# Patient Record
Sex: Male | Born: 1970
Health system: Southern US, Community
[De-identification: ages and names within clinical notes are randomized; demographics above are authoritative.]

## PROBLEM LIST (undated history)

## (undated) DIAGNOSIS — B019 Varicella without complication: Secondary | ICD-10-CM

## (undated) DIAGNOSIS — T7840XA Allergy, unspecified, initial encounter: Secondary | ICD-10-CM

## (undated) DIAGNOSIS — F319 Bipolar disorder, unspecified: Secondary | ICD-10-CM

## (undated) DIAGNOSIS — M1990 Unspecified osteoarthritis, unspecified site: Secondary | ICD-10-CM

## (undated) DIAGNOSIS — K219 Gastro-esophageal reflux disease without esophagitis: Secondary | ICD-10-CM

## (undated) DIAGNOSIS — F419 Anxiety disorder, unspecified: Secondary | ICD-10-CM

## (undated) HISTORY — PX: ANTERIOR CRUCIATE LIGAMENT REPAIR: SHX115

## (undated) HISTORY — PX: KNEE SURGERY: SHX244

## (undated) HISTORY — PX: ELBOW SURGERY: SHX618

## (undated) HISTORY — DX: Varicella without complication: B01.9

## (undated) HISTORY — DX: Bipolar disorder, unspecified: F31.9

## (undated) HISTORY — DX: Anxiety disorder, unspecified: F41.9

## (undated) HISTORY — DX: Gastro-esophageal reflux disease without esophagitis: K21.9

## (undated) HISTORY — DX: Unspecified osteoarthritis, unspecified site: M19.90

## (undated) HISTORY — DX: Allergy, unspecified, initial encounter: T78.40XA

---

## 2005-04-05 ENCOUNTER — Encounter (HOSPITAL_COMMUNITY): Admission: RE | Admit: 2005-04-05 | Discharge: 2005-04-14 | Payer: Self-pay | Admitting: Orthopedic Surgery

## 2005-04-05 ENCOUNTER — Emergency Department (HOSPITAL_COMMUNITY): Admission: EM | Admit: 2005-04-05 | Discharge: 2005-04-05 | Payer: Self-pay | Admitting: Emergency Medicine

## 2005-04-05 ENCOUNTER — Ambulatory Visit: Payer: Self-pay | Admitting: Orthopedic Surgery

## 2005-04-05 ENCOUNTER — Ambulatory Visit (HOSPITAL_COMMUNITY): Payer: Self-pay | Admitting: Orthopedic Surgery

## 2005-09-10 ENCOUNTER — Emergency Department (HOSPITAL_COMMUNITY): Admission: EM | Admit: 2005-09-10 | Discharge: 2005-09-10 | Payer: Self-pay | Admitting: Emergency Medicine

## 2008-02-17 ENCOUNTER — Emergency Department (HOSPITAL_COMMUNITY): Admission: EM | Admit: 2008-02-17 | Discharge: 2008-02-17 | Payer: Self-pay | Admitting: Family Medicine

## 2009-08-03 ENCOUNTER — Emergency Department (HOSPITAL_COMMUNITY): Admission: EM | Admit: 2009-08-03 | Discharge: 2009-08-04 | Payer: Self-pay | Admitting: Emergency Medicine

## 2009-08-04 ENCOUNTER — Ambulatory Visit: Payer: Self-pay | Admitting: Psychiatry

## 2009-08-04 ENCOUNTER — Inpatient Hospital Stay (HOSPITAL_COMMUNITY): Admission: AD | Admit: 2009-08-04 | Discharge: 2009-08-07 | Payer: Self-pay | Admitting: Psychiatry

## 2010-05-12 ENCOUNTER — Ambulatory Visit (HOSPITAL_COMMUNITY)
Admission: RE | Admit: 2010-05-12 | Discharge: 2010-05-12 | Payer: Self-pay | Source: Home / Self Care | Attending: Internal Medicine | Admitting: Internal Medicine

## 2010-06-17 ENCOUNTER — Encounter: Payer: Self-pay | Admitting: Orthopedic Surgery

## 2010-06-30 ENCOUNTER — Ambulatory Visit: Payer: Self-pay | Admitting: Orthopedic Surgery

## 2010-07-01 ENCOUNTER — Encounter: Payer: Self-pay | Admitting: Orthopedic Surgery

## 2010-07-07 LAB — HEPATIC FUNCTION PANEL
ALT: 40 U/L (ref 0–53)
AST: 22 U/L (ref 0–37)
Albumin: 4.1 g/dL (ref 3.5–5.2)
Total Bilirubin: 0.7 mg/dL (ref 0.3–1.2)

## 2010-07-07 LAB — DIFFERENTIAL
Basophils Absolute: 0 10*3/uL (ref 0.0–0.1)
Basophils Relative: 0 % (ref 0–1)
Lymphocytes Relative: 34 % (ref 12–46)
Monocytes Relative: 9 % (ref 3–12)
Neutro Abs: 4.4 10*3/uL (ref 1.7–7.7)
Neutrophils Relative %: 55 % (ref 43–77)

## 2010-07-07 LAB — URINALYSIS, ROUTINE W REFLEX MICROSCOPIC
Bilirubin Urine: NEGATIVE
Glucose, UA: NEGATIVE mg/dL
Hgb urine dipstick: NEGATIVE
Ketones, ur: NEGATIVE mg/dL
Protein, ur: NEGATIVE mg/dL
Urobilinogen, UA: 0.2 mg/dL (ref 0.0–1.0)

## 2010-07-07 LAB — ETHANOL: Alcohol, Ethyl (B): <5 mg/dL (ref 0–10)

## 2010-07-07 LAB — BASIC METABOLIC PANEL
CO2: 30 mEq/L (ref 19–32)
Calcium: 9.1 mg/dL (ref 8.4–10.5)
Creatinine, Ser: 1.1 mg/dL (ref 0.4–1.5)
GFR calc Af Amer: >60 mL/min (ref >60)
GFR calc non Af Amer: >60 mL/min (ref >60)
Sodium: 140 mEq/L (ref 135–145)

## 2010-07-07 LAB — RAPID URINE DRUG SCREEN, HOSP PERFORMED
Amphetamines: NOT DETECTED
Benzodiazepines: POSITIVE — AB
Tetrahydrocannabinol: POSITIVE — AB

## 2010-07-07 LAB — CBC
Hemoglobin: 15.1 g/dL (ref 13.0–17.0)
MCHC: 34.6 g/dL (ref 30.0–36.0)
RBC: 5.01 MIL/uL (ref 4.22–5.81)
WBC: 7.9 10*3/uL (ref 4.0–10.5)

## 2010-07-07 NOTE — Letter (Signed)
Summary: [facility] No Show  [facility]  [address]  [place], [place] [address]   Phone: [phone]  Fax: [phone]    07/01/10  [facility] Attention:  Referrals Fax:  [phone]   Re:    Ryan Hoffman DOB:    03/06/1971    The above named patient was a no show for the scheduled appointment:  Date:   06/30/10 Time:  10:00AM  Thank you for your kind referral.  Please feel free to contact our office if we can be of further service.  Sincerely,   [facility] Office of Dr. Terrance Mass, MD

## 2013-03-26 ENCOUNTER — Ambulatory Visit (HOSPITAL_COMMUNITY)
Admission: RE | Admit: 2013-03-26 | Discharge: 2013-03-26 | Disposition: A | Discharge status: Home / Self Care | Payer: 59 | Source: Ambulatory Visit | Attending: Sports Medicine | Admitting: Sports Medicine

## 2013-03-26 ENCOUNTER — Other Ambulatory Visit (HOSPITAL_COMMUNITY): Payer: Self-pay | Admitting: Sports Medicine

## 2013-03-26 DIAGNOSIS — IMO0002 Reserved for concepts with insufficient information to code with codable children: Secondary | ICD-10-CM

## 2013-03-26 DIAGNOSIS — T1590XA Foreign body on external eye, part unspecified, unspecified eye, initial encounter: Secondary | ICD-10-CM | POA: Insufficient documentation

## 2013-11-20 ENCOUNTER — Other Ambulatory Visit (HOSPITAL_COMMUNITY): Payer: Self-pay | Admitting: Physician Assistant

## 2013-11-20 DIAGNOSIS — R1031 Right lower quadrant pain: Secondary | ICD-10-CM

## 2013-11-20 DIAGNOSIS — R1032 Left lower quadrant pain: Secondary | ICD-10-CM

## 2013-11-23 ENCOUNTER — Ambulatory Visit (HOSPITAL_COMMUNITY)
Admission: RE | Admit: 2013-11-23 | Discharge: 2013-11-23 | Disposition: A | Discharge status: Home / Self Care | Payer: 59 | Source: Ambulatory Visit | Attending: Physician Assistant | Admitting: Physician Assistant

## 2013-11-23 DIAGNOSIS — R1031 Right lower quadrant pain: Secondary | ICD-10-CM

## 2013-11-23 DIAGNOSIS — R109 Unspecified abdominal pain: Secondary | ICD-10-CM | POA: Diagnosis present

## 2013-11-23 DIAGNOSIS — R1032 Left lower quadrant pain: Secondary | ICD-10-CM

## 2013-11-23 MED ORDER — IOHEXOL 300 MG/ML  SOLN
100.0000 mL | Freq: Once | INTRAMUSCULAR | Status: AC | PRN
  Administered 2013-11-23: 100 mL via INTRAVENOUS

## 2015-02-21 ENCOUNTER — Ambulatory Visit (INDEPENDENT_AMBULATORY_CARE_PROVIDER_SITE_OTHER): Payer: 59 | Admitting: Family

## 2015-02-21 ENCOUNTER — Other Ambulatory Visit (INDEPENDENT_AMBULATORY_CARE_PROVIDER_SITE_OTHER): Payer: 59

## 2015-02-21 ENCOUNTER — Encounter: Payer: Self-pay | Admitting: Family

## 2015-02-21 VITALS — BP 138/90 | HR 76 | Temp 98.3°F | Resp 18 | Ht 66.0 in | Wt 193.4 lb

## 2015-02-21 DIAGNOSIS — M25561 Pain in right knee: Secondary | ICD-10-CM

## 2015-02-21 DIAGNOSIS — F3162 Bipolar disorder, current episode mixed, moderate: Secondary | ICD-10-CM | POA: Insufficient documentation

## 2015-02-21 LAB — COMPREHENSIVE METABOLIC PANEL
ALBUMIN: 4.3 g/dL (ref 3.5–5.2)
ALK PHOS: 63 U/L (ref 39–117)
ALT: 34 U/L (ref 0–53)
AST: 19 U/L (ref 0–37)
BILIRUBIN TOTAL: 0.5 mg/dL (ref 0.2–1.2)
BUN: 15 mg/dL (ref 6–23)
CALCIUM: 9.5 mg/dL (ref 8.4–10.5)
CO2: 31 mEq/L (ref 19–32)
CREATININE: 1.02 mg/dL (ref 0.40–1.50)
Chloride: 104 mEq/L (ref 96–112)
GFR: 84.76 mL/min (ref >60.00)
Glucose, Bld: 87 mg/dL (ref 70–99)
Potassium: 4.3 mEq/L (ref 3.5–5.1)
Sodium: 139 mEq/L (ref 135–145)
TOTAL PROTEIN: 7 g/dL (ref 6.0–8.3)

## 2015-02-21 MED ORDER — DIVALPROEX SODIUM ER 500 MG PO TB24: 500.0000 mg | ORAL_TABLET | Freq: Every day | ORAL | Status: DC

## 2015-02-21 MED ORDER — LAMOTRIGINE 25 MG PO TABS: ORAL_TABLET | ORAL | Status: DC

## 2015-02-21 NOTE — Patient Instructions (Signed)
Thank you for choosing [facility].  Summary/Instructions:  If your symptoms worsen or fail to improve, please contact our office for further instruction, or in case of emergency go directly to the emergency room at the closest medical facility.   Please take your medication as prescribed   Bipolar Disorder Bipolar disorder is a mental illness. The term bipolar disorder actually is used to describe a group of disorders that all share varying degrees of emotional highs and lows that can interfere with daily functioning, such as work, school, or relationships. Bipolar disorder also can lead to drug abuse, hospitalization, and suicide. The emotional highs of bipolar disorder are periods of elation or irritability and high energy. These highs can range from a mild form (hypomania) to a severe form (mania). People experiencing episodes of hypomania may appear energetic, excitable, and highly productive. People experiencing mania may behave impulsively or erratically. They often make poor decisions. They may have difficulty sleeping. The most severe episodes of mania can involve having very distorted beliefs or perceptions about the world and seeing or hearing things that are not real (psychotic delusions and hallucinations).  The emotional lows of bipolar disorder (depression) also can range from mild to severe. Severe episodes of bipolar depression can involve psychotic delusions and hallucinations. Sometimes people with bipolar disorder experience a state of mixed mood. Symptoms of hypomania or mania and depression are both present during this mixed-mood episode. SIGNS AND SYMPTOMS There are signs and symptoms of the episodes of hypomania and mania as well as the episodes of depression. The signs and symptoms of hypomania and mania are similar but vary in severity. They include:  Inflated self-esteem or feeling of increased self-confidence.  Decreased need for sleep.  Unusual talkativeness  (rapid or pressured speech) or the feeling of a need to keep talking.  Sensation of racing thoughts or constant talking, with quick shifts between topics that may or may not be related (flight of ideas).  Decreased ability to focus or concentrate.  Increased purposeful activity, such as work, studies, or social activity, or nonproductive activity, such as pacing, squirming and fidgeting, or finger and toe tapping.  Impulsive behavior and use of poor judgment, resulting in high-risk activities, such as having unprotected sex or spending excessive amounts of money. Signs and symptoms of depression include the following:   Feelings of sadness, hopelessness, or helplessness.  Frequent or uncontrollable episodes of crying.  Lack of feeling anything or caring about anything.  Difficulty sleeping or sleeping too much.  Inability to enjoy the things you used to enjoy.   Desire to be alone all the time.   Feelings of guilt or worthlessness.  Lack of energy or motivation.   Difficulty concentrating, remembering, or making decisions.  Change in appetite or weight beyond normal fluctuations.  Thoughts of death or the desire to harm yourself. DIAGNOSIS  Bipolar disorder is diagnosed through an assessment by your caregiver. Your caregiver will ask questions about your emotional episodes. There are two main types of bipolar disorder. People with type I bipolar disorder have manic episodes with or without depressive episodes. People with type II bipolar disorder have hypomanic episodes and major depressive episodes, which are more serious than mild depression. The type of bipolar disorder you have can make an important difference in how your illness is monitored and treated. Your caregiver may ask questions about your medical history and use of alcohol or drugs, including prescription medication. Certain medical conditions and substances also can cause emotional highs and  lows that resemble  bipolar disorder (secondary bipolar disorder).  TREATMENT  Bipolar disorder is a long-term illness. It is best controlled with continuous treatment rather than treatment only when symptoms occur. The following treatments can be prescribed for bipolar disorders:  Medication--Medication can be prescribed by a doctor that is an expert in treating mental disorders (psychiatrists). Medications called mood stabilizers are usually prescribed to help control the illness. Other medications are sometimes added if symptoms of mania, depression, or psychotic delusions and hallucinations occur despite the use of a mood stabilizer.  Talk therapy--Some forms of talk therapy are helpful in providing support, education, and guidance. A combination of medication and talk therapy is best for managing the disorder over time. A procedure in which electricity is applied to your brain through your scalp (electroconvulsive therapy) is used in cases of severe mania when medication and talk therapy do not work or work too slowly.   This information is not intended to replace advice given to you by your health care provider. Make sure you discuss any questions you have with your health care provider.   Document Released: 07/12/2000 Document Revised: 04/26/2014 Document Reviewed: 05/01/2012 Elsevier Interactive Patient Education [employer]2016 [employer] Inc.

## 2015-02-21 NOTE — Assessment & Plan Note (Signed)
Bipolar appears uncontrolled with no medication and being easily agitated. Noted increased stability when on the medication. Start Lamictal and Depakote. Obtain BMET for baseline metabolic function prior to medication. Follow up in 1 month. Encouraged follow up with psychiatry for long term medication management.

## 2015-02-21 NOTE — Progress Notes (Signed)
Subjective:    Patient ID: Ryan LangeSteven C Scarlata, male    DOB: 1970/08/25, 44 y.o.   MRN: [id number]  Chief Complaint  Patient presents with  . Establish Care    was diagnosed bipolar years ago and said he did not like the way the medication made him feel, wife states he needs medication    HPI:  Ryan Hoffman is a 44 y.o. male who  has a past medical history of Arthritis; GERD (gastroesophageal reflux disease); Allergy; Bipolar 1 disorder (HCC); and Chicken pox. and presents today for an office visit to establish care. His wife is present for today's visit and provides some of the history  1.) Bipolar - Previously diagnosed with Bipolar and not currently maintained on medication. His wife describes that he become very agitated and has a very short fuse. He was previously on medications including Lamictal, Depakote and Xanax. He discontinued taking the medicationt 3 years ago. Reports sleepy feelings with the Xanax. He has been noted to have fatigue and depression-like symptoms for the past couple of years since being off the medication.   2.) Chronic knee pain - Associated symptom of pain located in his right knee has been going on for several years. Describes history of multiple surgeries. Pain is located throughout the entire knee. There are no modifying factors that make it better or worse.    No Known Allergies   No outpatient prescriptions prior to visit.   No facility-administered medications prior to visit.     Past Medical History  Diagnosis Date  . Arthritis   . GERD (gastroesophageal reflux disease)   . Allergy   . Bipolar 1 disorder (HCC)   . Chicken pox      Past Surgical History  Procedure Laterality Date  . Anterior cruciate ligament repair    . Knee surgery    . Elbow surgery       Family History  Problem Relation Age of Onset  . Hyperlipidemia Mother   . Hyperlipidemia Father   . Hypertension Father   . Arthritis Maternal Grandmother   . Heart  disease Maternal Grandmother   . Arthritis Maternal Grandfather   . Heart disease Maternal Grandfather   . Arthritis Paternal Grandmother   . Heart disease Paternal Grandmother   . Arthritis Paternal Grandfather   . Heart disease Paternal Grandfather     Social History   Social History  . Marital Status: Married    Spouse Name: [name]  . Number of Children: 4  . Years of Education: 12   Occupational History  . Electrician    Social History Main Topics  . Smoking status: Current Some Day Smoker    Types: Cigarettes  . Smokeless tobacco: Current User    Types: Snuff  . Alcohol Use: 3.6 - 7.2 oz/week    0 Standard drinks or equivalent, 6-12 Cans of beer per week  . Drug Use: No  . Sexual Activity: Not on file   Other Topics Concern  . Not on file   Social History Narrative   Denies religious beliefs effecting health care.     Review of Systems  Constitutional: Negative for fever and chills.  Respiratory: Negative for chest tightness.   Psychiatric/Behavioral: Positive for agitation.      Objective:    BP 138/90 mmHg  Pulse 76  Temp(Src) 98.3 F (36.8 C) (Oral)  Resp 18  Ht 5\' 6"  (1.676 m)  Wt 193 lb 6.4 oz (87.726 kg)  BMI 31.23  kg/m2  SpO2 98% Nursing note and vital signs reviewed.  Physical Exam  Constitutional: He is oriented to person, place, and time. He appears well-developed and well-nourished. No distress.  Cardiovascular: Normal rate, regular rhythm, normal heart sounds and intact distal pulses.   Pulmonary/Chest: Effort normal and breath sounds normal.  Musculoskeletal:  Right knee - No obvious deformity or discoloration noted. There is mild amount of edema. There is diffuse tenderness throughout the anterior and posterior aspects. Ligamentous and meniscal testing are negative. ROM is normal. Distal pulses and sensation are intact and appropriate.   Neurological: He is alert and oriented to person, place, and time.  Skin: Skin is warm and dry.    Psychiatric: He has a normal mood and affect. His behavior is normal. Judgment and thought content normal.       Assessment & Plan:   Problem List Items Addressed This Visit      Other   Bipolar 1 disorder, mixed, moderate (HCC) - Primary    Bipolar appears uncontrolled with no medication and being easily agitated. Noted increased stability when on the medication. Start Lamictal and Depakote. Obtain BMET for baseline metabolic function prior to medication. Follow up in 1 month. Encouraged follow up with psychiatry for long term medication management.       Relevant Orders   Comprehensive metabolic panel (Completed)   Right knee pain    Symptoms and exam consistent with arthritis secondary to multiple knee surgeries. Start Pennsaid sample. Follow up if symptoms improve with medication for refill.

## 2015-02-21 NOTE — Progress Notes (Signed)
Pre visit review using our clinic review tool, if applicable. No additional management support is needed unless otherwise documented below in the visit note.  Refused flu shot 

## 2015-02-21 NOTE — Assessment & Plan Note (Signed)
Symptoms and exam consistent with arthritis secondary to multiple knee surgeries. Start Pennsaid sample. Follow up if symptoms improve with medication for refill.

## 2015-02-24 ENCOUNTER — Telehealth: Payer: Self-pay | Admitting: Family

## 2015-02-24 NOTE — Telephone Encounter (Signed)
Please inform patient that his kidney function, liver function and electrolytes are all within the normal limits. This will serve as a baseline for further drug monitoring.

## 2015-02-26 NOTE — Telephone Encounter (Signed)
Pt aware of lab results 

## 2015-03-21 ENCOUNTER — Ambulatory Visit (INDEPENDENT_AMBULATORY_CARE_PROVIDER_SITE_OTHER): Payer: 59 | Admitting: Family

## 2015-03-21 ENCOUNTER — Encounter: Payer: Self-pay | Admitting: Family

## 2015-03-21 VITALS — BP 134/88 | HR 75 | Temp 97.6°F | Resp 18 | Ht 66.0 in | Wt 199.0 lb

## 2015-03-21 DIAGNOSIS — E349 Endocrine disorder, unspecified: Secondary | ICD-10-CM

## 2015-03-21 DIAGNOSIS — Z0000 Encounter for general adult medical examination without abnormal findings: Secondary | ICD-10-CM

## 2015-03-21 DIAGNOSIS — Z23 Encounter for immunization: Secondary | ICD-10-CM | POA: Diagnosis not present

## 2015-03-21 DIAGNOSIS — Z0001 Encounter for general adult medical examination with abnormal findings: Secondary | ICD-10-CM | POA: Insufficient documentation

## 2015-03-21 DIAGNOSIS — F3162 Bipolar disorder, current episode mixed, moderate: Secondary | ICD-10-CM

## 2015-03-21 MED ORDER — LAMOTRIGINE 100 MG PO TABS: 100.0000 mg | ORAL_TABLET | Freq: Every day | ORAL | Status: DC

## 2015-03-21 MED ORDER — DIVALPROEX SODIUM ER 500 MG PO TB24: 500.0000 mg | ORAL_TABLET | Freq: Every day | ORAL | Status: DC

## 2015-03-21 NOTE — Progress Notes (Signed)
Pre visit review using our clinic review tool, if applicable. No additional management support is needed unless otherwise documented below in the visit note. 

## 2015-03-21 NOTE — Assessment & Plan Note (Addendum)
1) Anticipatory Guidance: Discussed importance of wearing a seatbelt while driving and not texting while driving; changing batteries in smoke detector at least once annually; wearing suntan lotion when outside; eating a balanced and moderate diet; getting physical activity at least 30 minutes per day.  2) Immunizations / Screenings / Labs:  Tetanus updated today. Declines flu shot. All other immunizations are up to date per recommendations. Due for a vision screen which will scheduled at his convenience. All other screenings are up to date per recommendations. Obtain CBC, Testosterone, Lipid profile and TSH.   Overall well exam with risk factors for cardiovascular disease including nutrition, obesity, and tobacco use. Discussed importance of eating a balanced, varied and moderate diet that is focused on nutrient dense foods and low in saturated and transfer fats. Continue physical activity. Recommend weight loss of 5-10% of current body weight. Discussed tobacco cessation and information provided on steps to quit. Patient is not ready to quit at present time. We'll continue to monitor. Previous concern for low testosterone. Will check testosterone. Continue other healthy lifestyle behaviors and choices. Follow-up prevention exam in 1 year and follow-up office visit pending blood work results when completed.

## 2015-03-21 NOTE — Patient Instructions (Addendum)
Thank you for choosing [facility].  Summary/Instructions:  Please stop by the lab on the basement level of the building for your blood work. Your results will be released to MyChart (or called to you) after review, usually within 72 hours after test completion. If any changes need to be made, you will be notified at that same time.   Health Maintenance, Male A healthy lifestyle and preventative care can promote health and wellness.  Maintain regular health, dental, and eye exams.  Eat a healthy diet. Foods like vegetables, fruits, whole grains, low-fat dairy products, and lean protein foods contain the nutrients you need and are low in calories. Decrease your intake of foods high in solid fats, added sugars, and salt. Get information about a proper diet from your health care provider, if necessary.  Regular physical exercise is one of the most important things you can do for your health. Most adults should get at least 150 minutes of moderate-intensity exercise (any activity that increases your heart rate and causes you to sweat) each week. In addition, most adults need muscle-strengthening exercises on 2 or more days a week.   Maintain a healthy weight. The body mass index (BMI) is a screening tool to identify possible weight problems. It provides an estimate of body fat based on height and weight. Your health care provider can find your BMI and can help you achieve or maintain a healthy weight. For males 20 years and older:  A BMI below 18.5 is considered underweight.  A BMI of 18.5 to 24.9 is normal.  A BMI of 25 to 29.9 is considered overweight.  A BMI of 30 and above is considered obese.  Maintain normal blood lipids and cholesterol by exercising and minimizing your intake of saturated fat. Eat a balanced diet with plenty of fruits and vegetables. Blood tests for lipids and cholesterol should begin at age 43 and be repeated every 5 years. If your lipid or cholesterol levels  are high, you are over age 43, or you are at high risk for heart disease, you may need your cholesterol levels checked more frequently.Ongoing high lipid and cholesterol levels should be treated with medicines if diet and exercise are not working.  If you smoke, find out from your health care provider how to quit. If you do not use tobacco, do not start.  Lung cancer screening is recommended for adults aged 55-80 years who are at high risk for developing lung cancer because of a history of smoking. A yearly low-dose CT scan of the lungs is recommended for people who have at least a 30-pack-year history of smoking and are current smokers or have quit within the past 15 years. A pack year of smoking is smoking an average of 1 pack of cigarettes a day for 1 year (for example, a 30-pack-year history of smoking could mean smoking 1 pack a day for 30 years or 2 packs a day for 15 years). Yearly screening should continue until the smoker has stopped smoking for at least 15 years. Yearly screening should be stopped for people who develop a health problem that would prevent them from having lung cancer treatment.  If you choose to drink alcohol, do not have more than 2 drinks per day. One drink is considered to be 12 oz (360 mL) of beer, 5 oz (150 mL) of wine, or 1.5 oz (45 mL) of liquor.  Avoid the use of street drugs. Do not share needles with anyone. Ask for help if you  need support or instructions about stopping the use of drugs.  High blood pressure causes heart disease and increases the risk of stroke. High blood pressure is more likely to develop in:  People who have blood pressure in the end of the normal range (100-139/85-89 mm Hg).  People who are overweight or obese.  People who are African American.  If you are 16-2 years of age, have your blood pressure checked every 3-5 years. If you are 32 years of age or older, have your blood pressure checked every year. You should have your blood  pressure measured twice--once when you are at a hospital or clinic, and once when you are not at a hospital or clinic. Record the average of the two measurements. To check your blood pressure when you are not at a hospital or clinic, you can use:  An automated blood pressure machine at a pharmacy.  A home blood pressure monitor.  If you are 21-17 years old, ask your health care provider if you should take aspirin to prevent heart disease.  Diabetes screening involves taking a blood sample to check your fasting blood sugar level. This should be done once every 3 years after age 67 if you are at a normal weight and without risk factors for diabetes. Testing should be considered at a younger age or be carried out more frequently if you are overweight and have at least 1 risk factor for diabetes.  Colorectal cancer can be detected and often prevented. Most routine colorectal cancer screening begins at the age of 65 and continues through age 22. However, your health care provider may recommend screening at an earlier age if you have risk factors for colon cancer. On a yearly basis, your health care provider may provide home test kits to check for hidden blood in the stool. A small camera at the end of a tube may be used to directly examine the colon (sigmoidoscopy or colonoscopy) to detect the earliest forms of colorectal cancer. Talk to your health care provider about this at age 90 when routine screening begins. A direct exam of the colon should be repeated every 5-10 years through age 54, unless early forms of precancerous polyps or small growths are found.  People who are at an increased risk for hepatitis B should be screened for this virus. You are considered at high risk for hepatitis B if:  You were born in a country where hepatitis B occurs often. Talk with your health care provider about which countries are considered high risk.  Your parents were born in a high-risk country and you have not  received a shot to protect against hepatitis B (hepatitis B vaccine).  You have HIV or AIDS.  You use needles to inject street drugs.  You live with, or have sex with, someone who has hepatitis B.  You are a man who has sex with other men (MSM).  You get hemodialysis treatment.  You take certain medicines for conditions like cancer, organ transplantation, and autoimmune conditions.  Hepatitis C blood testing is recommended for all people born from [birth year] through 1964 and any individual with known risk factors for hepatitis C.  Healthy men should no longer receive prostate-specific antigen (PSA) blood tests as part of routine cancer screening. Talk to your health care provider about prostate cancer screening.  Testicular cancer screening is not recommended for adolescents or adult males who have no symptoms. Screening includes self-exam, a health care provider exam, and other screening tests. Consult with  your health care provider about any symptoms you have or any concerns you have about testicular cancer.  Practice safe sex. Use condoms and avoid high-risk sexual practices to reduce the spread of sexually transmitted infections (STIs).  You should be screened for STIs, including gonorrhea and chlamydia if:  You are sexually active and are younger than 24 years.  You are older than 24 years, and your health care provider tells you that you are at risk for this type of infection.  Your sexual activity has changed since you were last screened, and you are at an increased risk for chlamydia or gonorrhea. Ask your health care provider if you are at risk.  If you are at risk of being infected with HIV, it is recommended that you take a prescription medicine daily to prevent HIV infection. This is called pre-exposure prophylaxis (PrEP). You are considered at risk if:  You are a man who has sex with other men (MSM).  You are a heterosexual man who is sexually active with multiple  partners.  You take drugs by injection.  You are sexually active with a partner who has HIV.  Talk with your health care provider about whether you are at high risk of being infected with HIV. If you choose to begin PrEP, you should first be tested for HIV. You should then be tested every 3 months for as long as you are taking PrEP.  Use sunscreen. Apply sunscreen liberally and repeatedly throughout the day. You should seek shade when your shadow is shorter than you. Protect yourself by wearing long sleeves, pants, a wide-brimmed hat, and sunglasses year round whenever you are outdoors.  Tell your health care provider of new moles or changes in moles, especially if there is a change in shape or color. Also, tell your health care provider if a mole is larger than the size of a pencil eraser.  A one-time screening for abdominal aortic aneurysm (AAA) and surgical repair of large AAAs by ultrasound is recommended for men aged 65-75 years who are current or former smokers.  Stay current with your vaccines (immunizations).   This information is not intended to replace advice given to you by your health care provider. Make sure you discuss any questions you have with your health care provider.   Document Released: 10/02/2007 Document Revised: 04/26/2014 Document Reviewed: 08/31/2010 Elsevier Interactive Patient Education 2016 [employer].  Steps to Quit Smoking  Smoking tobacco can be harmful to your health and can affect almost every organ in your body. Smoking puts you, and those around you, at risk for developing many serious chronic diseases. Quitting smoking is difficult, but it is one of the best things that you can do for your health. It is never too late to quit. WHAT ARE THE BENEFITS OF QUITTING SMOKING? When you quit smoking, you lower your risk of developing serious diseases and conditions, such as:  Lung cancer or lung disease, such as COPD.  Heart disease.  Stroke.  Heart  attack.  Infertility.  Osteoporosis and bone fractures. Additionally, symptoms such as coughing, wheezing, and shortness of breath may get better when you quit. You may also find that you get sick less often because your body is stronger at fighting off colds and infections. If you are pregnant, quitting smoking can help to reduce your chances of having a baby of low birth weight. HOW DO I GET READY TO QUIT? When you decide to quit smoking, create a plan to make sure that  you are successful. Before you quit:  Pick a date to quit. Set a date within the next two weeks to give you time to prepare.  Write down the reasons why you are quitting. Keep this list in places where you will see it often, such as on your bathroom mirror or in your car or wallet.  Identify the people, places, things, and activities that make you want to smoke (triggers) and avoid them. Make sure to take these actions:  Throw away all cigarettes at home, at work, and in your car.  Throw away smoking accessories, such as Set designerashtrays and lighters.  Clean your car and make sure to empty the ashtray.  Clean your home, including curtains and carpets.  Tell your family, friends, and coworkers that you are quitting. Support from your loved ones can make quitting easier.  Talk with your health care provider about your options for quitting smoking.  Find out what treatment options are covered by your health insurance. WHAT STRATEGIES CAN I USE TO QUIT SMOKING?  Talk with your healthcare provider about different strategies to quit smoking. Some strategies include:  Quitting smoking altogether instead of gradually lessening how much you smoke over a period of time. Research shows that quitting "cold [place]key" is more successful than gradually quitting.  Attending in-person counseling to help you build problem-solving skills. You are more likely to have success in quitting if you attend several counseling sessions. Even short  sessions of 10 minutes can be effective.  Finding resources and support systems that can help you to quit smoking and remain smoke-free after you quit. These resources are most helpful when you use them often. They can include:  Online chats with a Veterinary surgeoncounselor.  Telephone quitlines.  Printed Materials engineerself-help materials.  Support groups or group counseling.  Text messaging programs.  Mobile phone applications.  Taking medicines to help you quit smoking. (If you are pregnant or breastfeeding, talk with your health care provider first.) Some medicines contain nicotine and some do not. Both types of medicines help with cravings, but the medicines that include nicotine help to relieve withdrawal symptoms. Your health care provider may recommend:  Nicotine patches, gum, or lozenges.  Nicotine inhalers or sprays.  Non-nicotine medicine that is taken by mouth. Talk with your health care provider about combining strategies, such as taking medicines while you are also receiving in-person counseling. Using these two strategies together makes you more likely to succeed in quitting than if you used either strategy on its own. If you are pregnant or breastfeeding, talk with your health care provider about finding counseling or other support strategies to quit smoking. Do not take medicine to help you quit smoking unless told to do so by your health care provider. WHAT THINGS CAN I DO TO MAKE IT EASIER TO QUIT? Quitting smoking might feel overwhelming at first, but there is a lot that you can do to make it easier. Take these important actions:  Reach out to your family and friends and ask that they support and encourage you during this time. Call telephone quitlines, reach out to support groups, or work with a counselor for support.  Ask people who smoke to avoid smoking around you.  Avoid places that trigger you to smoke, such as bars, parties, or smoke-break areas at work.  Spend time around people who do  not smoke.  Lessen stress in your life, because stress can be a smoking trigger for some people. To lessen stress, try:  Exercising regularly.  Deep-breathing exercises.  Yoga.  Meditating.  Performing a body scan. This involves closing your eyes, scanning your body from head to toe, and noticing which parts of your body are particularly tense. Purposefully relax the muscles in those areas.  Download or purchase mobile phone or tablet apps (applications) that can help you stick to your quit plan by providing reminders, tips, and encouragement. There are many free apps, such as QuitGuide from the [employer] developer for Disease Control and Prevention). You can find other support for quitting smoking (smoking cessation) through smokefree.gov and other websites. HOW WILL I FEEL WHEN I QUIT SMOKING? Within the first 24 hours of quitting smoking, you may start to feel some withdrawal symptoms. These symptoms are usually most noticeable 2-3 days after quitting, but they usually do not last beyond 2-3 weeks. Changes or symptoms that you might experience include:  Mood swings.  Restlessness, anxiety, or irritation.  Difficulty concentrating.  Dizziness.  Strong cravings for sugary foods in addition to nicotine.  Mild weight gain.  Constipation.  Nausea.  Coughing or a sore throat.  Changes in how your medicines work in your body.  A depressed mood.  Difficulty sleeping (insomnia). After the first 2-3 weeks of quitting, you may start to notice more positive results, such as:  Improved sense of smell and taste.  Decreased coughing and sore throat.  Slower heart rate.  Lower blood pressure.  Clearer skin.  The ability to breathe more easily.  Fewer sick days. Quitting smoking is very challenging for most people. Do not get discouraged if you are not successful the first time. Some people need to make many attempts to quit before they achieve long-term success. Do your best to  stick to your quit plan, and talk with your health care provider if you have any questions or concerns.   This information is not intended to replace advice given to you by your health care provider. Make sure you discuss any questions you have with your health care provider.   Document Released: 03/30/2001 Document Revised: 08/20/2014 Document Reviewed: 08/20/2014 Elsevier Interactive Patient Education [employer]

## 2015-03-21 NOTE — Progress Notes (Signed)
Subjective:    Patient ID: Ryan Hoffman, male    DOB: November 11, 1970, 44 y.o.   MRN: [id number]  Chief Complaint  Patient presents with  . CPE    Not fasting    HPI:  Ryan Hoffman is a 44 y.o. male who presents today for an annual wellness visit.   1) Health Maintenance -   Diet - Averages 2-3 meals per day consisting of breakfast sandwich, pork, beef, occasional fruits and vegetables; Caffeine intake 1-2 cups per day.   Exercise - Resistance training 3-4x per week.    2) Preventative Exams / Immunizations:  Dental -- Up to date  Vision -- Due for exam    Health Maintenance  Topic Date Due  . HIV Screening  03/31/1987  . TETANUS/TDAP  03/31/1991  . INFLUENZA VACCINE  01/02/2016 (Originally 11/18/2014)   Immunization History  Administered Date(s) Administered  . Tdap 03/21/2015    No Known Allergies   Outpatient Prescriptions Prior to Visit  Medication Sig Dispense Refill  . divalproex (DEPAKOTE ER) 500 MG 24 hr tablet Take 1 tablet (500 mg total) by mouth daily. 30 tablet 1  . lamoTRIgine (LAMICTAL) 25 MG tablet Week 1 and 2: Take 1 tablet by mouth daily. Week 3 and 4: Take 2 tablets by mouth daily 42 tablet 0   No facility-administered medications prior to visit.     Past Medical History  Diagnosis Date  . Arthritis   . GERD (gastroesophageal reflux disease)   . Allergy   . Bipolar 1 disorder (HCC)   . Chicken pox      Past Surgical History  Procedure Laterality Date  . Anterior cruciate ligament repair    . Knee surgery    . Elbow surgery       Family History  Problem Relation Age of Onset  . Hyperlipidemia Mother   . Hyperlipidemia Father   . Hypertension Father   . Arthritis Maternal Grandmother   . Heart disease Maternal Grandmother   . Arthritis Maternal Grandfather   . Heart disease Maternal Grandfather   . Arthritis Paternal Grandmother   . Heart disease Paternal Grandmother   . Arthritis Paternal Grandfather   . Heart  disease Paternal Grandfather      Social History   Social History  . Marital Status: Married    Spouse Name: [name]  . Number of Children: 4  . Years of Education: 12   Occupational History  . Electrician    Social History Main Topics  . Smoking status: Current Some Day Smoker    Types: Cigarettes  . Smokeless tobacco: Current User    Types: Snuff  . Alcohol Use: 3.6 - 7.2 oz/week    0 Standard drinks or equivalent, 6-12 Cans of beer per week  . Drug Use: No  . Sexual Activity: Not on file   Other Topics Concern  . Not on file   Social History Narrative   Denies religious beliefs effecting health care.       Review of Systems  Constitutional: Denies fever, chills, fatigue, or significant weight gain/loss. HENT: Head: Denies headache or neck pain Ears: Denies changes in hearing, ringing in ears, earache, drainage Nose: Denies discharge, stuffiness, itching, nosebleed, sinus pain Throat: Denies sore throat, hoarseness, dry mouth, sores, thrush Eyes: Denies loss/changes in vision, pain, redness, blurry/double vision, flashing lights Cardiovascular: Denies chest pain/discomfort, tightness, palpitations, shortness of breath with activity, difficulty lying down, swelling, sudden awakening with shortness of breath Respiratory: Denies shortness  of breath, cough, sputum production, wheezing Gastrointestinal: Denies dysphasia, heartburn, change in appetite, nausea, change in bowel habits, rectal bleeding, constipation, diarrhea, yellow skin or eyes Genitourinary: Denies frequency, urgency, burning/pain, blood in urine, incontinence, change in urinary strength. Musculoskeletal: Denies muscle/joint pain, stiffness, back pain, redness or swelling of joints, trauma Skin: Denies rashes, lumps, itching, dryness, color changes, or hair/nail changes Neurological: Denies dizziness, fainting, seizures, weakness, numbness, tingling, tremor Psychiatric - Denies nervousness, stress, depression  or memory loss Endocrine: Denies heat or cold intolerance, sweating, frequent urination, excessive thirst, changes in appetite Hematologic: Denies ease of bruising or bleeding     Objective:     BP 134/88 mmHg  Pulse 75  Temp(Src) 97.6 F (36.4 C) (Oral)  Resp 18  Ht  (1.676 m)  Wt 199 lb (90.266 kg)  BMI 32.13 kg/m2  SpO2 98% Nursing note and vital signs reviewed.  Physical Exam  Constitutional: He is oriented to person, place, and time. He appears well-developed and well-nourished.  HENT:  Head: Normocephalic.  Right Ear: Hearing, tympanic membrane, external ear and ear canal normal.  Left Ear: Hearing, tympanic membrane, external ear and ear canal normal.  Nose: Nose normal.  Mouth/Throat: Uvula is midline, oropharynx is clear and moist and mucous membranes are normal.  Eyes: Conjunctivae and EOM are normal. Pupils are equal, round, and reactive to light.  Neck: Neck supple. No JVD present. No tracheal deviation present. No thyromegaly present.  Cardiovascular: Normal rate, regular rhythm, normal heart sounds and intact distal pulses.   Pulmonary/Chest: Effort normal and breath sounds normal.  Abdominal: Soft. Bowel sounds are normal. He exhibits no distension and no mass. There is no tenderness. There is no rebound and no guarding.  Musculoskeletal: Normal range of motion. He exhibits no edema or tenderness.  Lymphadenopathy:    He has no cervical adenopathy.  Neurological: He is alert and oriented to person, place, and time. He has normal reflexes. No cranial nerve deficit. He exhibits normal muscle tone. Coordination normal.  Skin: Skin is warm and dry.  Psychiatric: He has a normal mood and affect. His behavior is normal. Judgment and thought content normal.       Assessment & Plan:   Problem List Items Addressed This Visit      Other   Bipolar 1 disorder, mixed, moderate (HCC)   Relevant Medications   lamoTRIgine (LAMICTAL) 100 MG tablet   divalproex  (DEPAKOTE ER) 500 MG 24 hr tablet   Routine general medical examination at a health care facility - Primary    1) Anticipatory Guidance: Discussed importance of wearing a seatbelt while driving and not texting while driving; changing batteries in smoke detector at least once annually; wearing suntan lotion when outside; eating a balanced and moderate diet; getting physical activity at least 30 minutes per day.  2) Immunizations / Screenings / Labs:  Tetanus updated today. Declines flu shot. All other immunizations are up to date per recommendations. Due for a vision screen which will scheduled at his convenience. All other screenings are up to date per recommendations. Obtain CBC, Testosterone, Lipid profile and TSH.   Overall well exam with risk factors for cardiovascular disease including nutrition, obesity, and tobacco use. Discussed importance of eating a balanced, varied and moderate diet that is focused on nutrient dense foods and low in saturated and transfer fats. Continue physical activity. Recommend weight loss of 5-10% of current body weight. Discussed tobacco cessation and information provided on steps to quit. Patient is not ready  to quit at present time. We'll continue to monitor. Previous concern for low testosterone. Will check testosterone. Continue other healthy lifestyle behaviors and choices. Follow-up prevention exam in 1 year and follow-up office visit pending blood work results when completed.       Relevant Orders   CBC   Lipid panel   TSH   Testosterone   Tdap vaccine greater than or equal to 7yo IM (Completed)   Testosterone insufficiency   Relevant Orders   Testosterone    Other Visit Diagnoses    Need for diphtheria-tetanus-pertussis (Tdap) vaccine

## 2015-06-20 ENCOUNTER — Ambulatory Visit (INDEPENDENT_AMBULATORY_CARE_PROVIDER_SITE_OTHER): Payer: 59 | Admitting: Family

## 2015-06-20 ENCOUNTER — Other Ambulatory Visit (INDEPENDENT_AMBULATORY_CARE_PROVIDER_SITE_OTHER): Payer: 59

## 2015-06-20 ENCOUNTER — Encounter: Payer: Self-pay | Admitting: Family

## 2015-06-20 VITALS — BP 114/80 | HR 70 | Temp 97.8°F | Resp 16 | Ht 66.0 in | Wt 197.0 lb

## 2015-06-20 DIAGNOSIS — Z5181 Encounter for therapeutic drug level monitoring: Secondary | ICD-10-CM

## 2015-06-20 DIAGNOSIS — E291 Testicular hypofunction: Secondary | ICD-10-CM

## 2015-06-20 DIAGNOSIS — E349 Endocrine disorder, unspecified: Secondary | ICD-10-CM

## 2015-06-20 DIAGNOSIS — F3162 Bipolar disorder, current episode mixed, moderate: Secondary | ICD-10-CM | POA: Diagnosis not present

## 2015-06-20 DIAGNOSIS — Z0000 Encounter for general adult medical examination without abnormal findings: Secondary | ICD-10-CM

## 2015-06-20 LAB — LIPID PANEL
CHOL/HDL RATIO: 5
Cholesterol: 163 mg/dL (ref 0–200)
HDL: 36.1 mg/dL — AB (ref >39.00)
LDL Cholesterol: 112 mg/dL — ABNORMAL HIGH (ref 0–99)
NONHDL: 127.27
TRIGLYCERIDES: 76 mg/dL (ref 0.0–149.0)
VLDL: 15.2 mg/dL (ref 0.0–40.0)

## 2015-06-20 LAB — CBC
HCT: 45.2 % (ref 39.0–52.0)
HEMOGLOBIN: 15.2 g/dL (ref 13.0–17.0)
MCHC: 33.6 g/dL (ref 30.0–36.0)
MCV: 85.9 fl (ref 78.0–100.0)
PLATELETS: 285 10*3/uL (ref 150.0–400.0)
RBC: 5.26 Mil/uL (ref 4.22–5.81)
RDW: 13.1 % (ref 11.5–15.5)
WBC: 7.7 10*3/uL (ref 4.0–10.5)

## 2015-06-20 LAB — TESTOSTERONE: Testosterone: 277.4 ng/dL — ABNORMAL LOW (ref 300.00–890.00)

## 2015-06-20 LAB — TSH: TSH: 1.35 u[IU]/mL (ref 0.35–4.50)

## 2015-06-20 MED ORDER — DIVALPROEX SODIUM ER 500 MG PO TB24: 500.0000 mg | ORAL_TABLET | Freq: Every day | ORAL | Status: DC

## 2015-06-20 MED ORDER — LAMOTRIGINE 100 MG PO TABS: 100.0000 mg | ORAL_TABLET | Freq: Every day | ORAL | Status: DC

## 2015-06-20 NOTE — Patient Instructions (Addendum)
Thank you for choosing [facility].  Summary/Instructions:  Your prescription(s) have been submitted to your pharmacy or been printed and provided for you. Please take as directed and contact our office if you believe you are having problem(s) with the medication(s) or have any questions.  If your symptoms worsen or fail to improve, please contact our office for further instruction, or in case of emergency go directly to the emergency room at the closest medical facility.   Continue to take your medication as prescribed.   Please stop for labs before next appointment.

## 2015-06-20 NOTE — Progress Notes (Signed)
   Subjective:    Patient ID: Ryan Hoffman, male    DOB: [birth year], 45 y.o.   MRN: [id number]  Chief Complaint  Patient presents with  . Follow-up    HPI:  Ryan Hoffman is a 45 y.o. male who  has a past medical history of Arthritis; GERD (gastroesophageal reflux disease); Allergy; Bipolar 1 disorder (HCC); and Chicken pox. and presents today for a follow up office visit.   1.) Bipolar - Currently prescribed Depakote and Lamictal. Reports taking the medication as prescribed and denies adverse side effects. Notes improvement with his overall symptoms since starting the medication. Wife believes that there is still room for improvements. Denies any mania episodes.   Outpatient Prescriptions Prior to Visit  Medication Sig Dispense Refill  . divalproex (DEPAKOTE ER) 500 MG 24 hr tablet Take 1 tablet (500 mg total) by mouth daily. 90 tablet 0  . lamoTRIgine (LAMICTAL) 100 MG tablet Take 1 tablet (100 mg total) by mouth daily. 90 tablet 0   No facility-administered medications prior to visit.     No current outpatient prescriptions on file prior to visit.   No current facility-administered medications on file prior to visit.    Review of Systems  Constitutional: Negative for fever and chills.  Psychiatric/Behavioral: Negative for suicidal ideas, behavioral problems, sleep disturbance and dysphoric mood. The patient is not nervous/anxious.       Objective:    BP 114/80 mmHg  Pulse 70  Temp(Src) 97.8 F (36.6 C) (Oral)  Resp 16  Ht 5\' 6"  (1.676 m)  Wt 197 lb (89.359 kg)  BMI 31.81 kg/m2  SpO2 95% Nursing note and vital signs reviewed.  Physical Exam  Constitutional: He is oriented to person, place, and time. He appears well-developed and well-nourished. No distress.  Cardiovascular: Normal rate, regular rhythm, normal heart sounds and intact distal pulses.   Pulmonary/Chest: Effort normal and breath sounds normal.  Neurological: He is alert and oriented to person,  place, and time.  Skin: Skin is warm and dry.  Psychiatric: He has a normal mood and affect. His behavior is normal. Judgment and thought content normal.       Assessment & Plan:   Problem List Items Addressed This Visit      Other   Bipolar 1 disorder, mixed, moderate (HCC) - Primary    Bipolar appears improved with addition of divalproex and lamotrigine. Obtain valproic acid. Denies adverse side effects. Continue current dosage of of lamotrigine and divalproex. Follow up in 3 months or sooner.       Relevant Medications   divalproex (DEPAKOTE ER) 500 MG 24 hr tablet   lamoTRIgine (LAMICTAL) 100 MG tablet   Other Relevant Orders   Valproic Acid level    Other Visit Diagnoses    Encounter for therapeutic drug monitoring        Relevant Orders    Valproic Acid level

## 2015-06-20 NOTE — Progress Notes (Signed)
Pre visit review using our clinic review tool, if applicable. No additional management support is needed unless otherwise documented below in the visit note. 

## 2015-06-20 NOTE — Assessment & Plan Note (Signed)
Bipolar appears improved with addition of divalproex and lamotrigine. Obtain valproic acid. Denies adverse side effects. Continue current dosage of of lamotrigine and divalproex. Follow up in 3 months or sooner.

## 2015-06-22 ENCOUNTER — Telehealth: Payer: Self-pay | Admitting: Family

## 2015-06-22 NOTE — Telephone Encounter (Signed)
Please inform patient that his blood work shows that his white/red blood cells and thyroid function is normal. His HDL or good cholesterol is slightly low, however this can be managed through nutrition and physical activity and avoiding saturated fats. Lastly his testosterone was slightly low. We will continue to monitor it at this time.

## 2015-06-24 NOTE — Telephone Encounter (Signed)
Pt aware of results 

## 2015-07-02 ENCOUNTER — Other Ambulatory Visit: Payer: 59

## 2015-07-02 DIAGNOSIS — Z5181 Encounter for therapeutic drug level monitoring: Secondary | ICD-10-CM

## 2015-07-02 DIAGNOSIS — F3162 Bipolar disorder, current episode mixed, moderate: Secondary | ICD-10-CM

## 2015-07-03 ENCOUNTER — Telehealth: Payer: Self-pay | Admitting: Family

## 2015-07-03 DIAGNOSIS — F3162 Bipolar disorder, current episode mixed, moderate: Secondary | ICD-10-CM

## 2015-07-03 LAB — VALPROIC ACID LEVEL: VALPROIC ACID LVL: 24.9 ug/mL — AB (ref 50.0–100.0)

## 2015-07-03 NOTE — Telephone Encounter (Signed)
Please inform patient that his depakote levels are on the low side and there is room to increase the dose if his mood is not adequately controlled or regulated.

## 2015-07-08 NOTE — Telephone Encounter (Signed)
LVM letting pt know.  

## 2015-07-11 NOTE — Telephone Encounter (Signed)
Wife called in stating patient does want dosage increased.  [facility] [place] [facility] [place].

## 2015-07-14 MED ORDER — LAMOTRIGINE 150 MG PO TABS: 150.0000 mg | ORAL_TABLET | Freq: Every day | ORAL | Status: DC

## 2015-07-14 NOTE — Addendum Note (Signed)
Addended by: Jeanine LuzALONE, Dilan Novosad D on: 07/14/2015 09:26 AM   Modules accepted: Orders

## 2015-07-14 NOTE — Telephone Encounter (Signed)
Lamictal increased to 150 mg daily. Please recheck in about 1 month.

## 2015-07-14 NOTE — Telephone Encounter (Signed)
Pt aware.

## 2015-07-17 MED ORDER — DIVALPROEX SODIUM ER 500 MG PO TB24: 1000.0000 mg | ORAL_TABLET | Freq: Every day | ORAL | Status: DC

## 2015-07-17 NOTE — Telephone Encounter (Signed)
Medications adjusted

## 2015-07-17 NOTE — Addendum Note (Signed)
Addended by: Jeanine LuzALONE, GREGORY D on: 07/17/2015 10:46 PM   Modules accepted: Orders

## 2015-07-17 NOTE — Telephone Encounter (Signed)
Pt's wife called in wondering if the Depakote was the one to increase

## 2015-07-22 NOTE — Telephone Encounter (Signed)
Pt wife called in would like a

## 2015-09-23 ENCOUNTER — Other Ambulatory Visit: Payer: Self-pay | Admitting: Family

## 2015-09-23 NOTE — Telephone Encounter (Signed)
Wife left  sg on triage stating husband is out of medications, he has made appt for this Friday 6/9, wanting to know can med be refill, also does he need to have labs done prior to appt...Raechel Chute/lmb

## 2015-09-25 ENCOUNTER — Encounter: Payer: Self-pay | Admitting: Family

## 2015-09-25 ENCOUNTER — Ambulatory Visit (INDEPENDENT_AMBULATORY_CARE_PROVIDER_SITE_OTHER): Payer: 59 | Admitting: Family

## 2015-09-25 VITALS — BP 126/86 | HR 78 | Temp 98.0°F | Resp 16 | Ht 66.0 in | Wt 191.0 lb

## 2015-09-25 DIAGNOSIS — F3162 Bipolar disorder, current episode mixed, moderate: Secondary | ICD-10-CM

## 2015-09-25 MED ORDER — LAMOTRIGINE 150 MG PO TABS: 150.0000 mg | ORAL_TABLET | Freq: Every day | ORAL | Status: DC

## 2015-09-25 MED ORDER — DIVALPROEX SODIUM ER 500 MG PO TB24: 1000.0000 mg | ORAL_TABLET | Freq: Every day | ORAL | Status: DC

## 2015-09-25 NOTE — Patient Instructions (Addendum)
Thank you for choosing [facility].  Summary/Instructions:  Please continue to take your medications as prescribed.   Your prescription(s) have been submitted to your pharmacy or been printed and provided for you. Please take as directed and contact our office if you believe you are having problem(s) with the medication(s) or have any questions.  Please stop by the lab on the basement level of the building for your blood work. Your results will be released to MyChart (or called to you) after review, usually within 72 hours after test completion. If any changes need to be made, you will be notified at that same time.  If your symptoms worsen or fail to improve, please contact our office for further instruction, or in case of emergency go directly to the emergency room at the closest medical facility.     

## 2015-09-25 NOTE — Progress Notes (Signed)
   Subjective:    Patient ID: Ryan Hoffman, male    DOB: 09-03-1970, 45 y.o.   MRN: [id number]  Chief Complaint  Patient presents with  . Follow-up    medications doing good would like to see about getting 3 month supply    HPI:  Ryan Hoffman is a 45 y.o. male who  has a past medical history of Arthritis; GERD (gastroesophageal reflux disease); Allergy; Bipolar 1 disorder (HCC); and Chicken pox. and presents today for a follow up office visit.   1.) Bipolar - Currently maintained on divalproex and lamotrigine.Reports taking the medication as prescribed and denies adverse side effects. Notes his symptoms are adequately controlled with current regimen and he feels good.  No Known Allergies   No current outpatient prescriptions on file prior to visit.   No current facility-administered medications on file prior to visit.     Past Surgical History  Procedure Laterality Date  . Anterior cruciate ligament repair    . Knee surgery    . Elbow surgery        Review of Systems  Constitutional: Negative for fever and chills.  Psychiatric/Behavioral: Negative for suicidal ideas, sleep disturbance and dysphoric mood.      Objective:    BP 126/86 mmHg  Pulse 78  Temp(Src) 98 F (36.7 C) (Oral)  Resp 16  Ht 5\' 6"  (1.676 m)  Wt 191 lb (86.637 kg)  BMI 30.84 kg/m2  SpO2 97% Nursing note and vital signs reviewed.  Physical Exam  Constitutional: He is oriented to person, place, and time. He appears well-developed and well-nourished. No distress.  Cardiovascular: Normal rate, regular rhythm, normal heart sounds and intact distal pulses.   Pulmonary/Chest: Effort normal and breath sounds normal.  Neurological: He is alert and oriented to person, place, and time.  Skin: Skin is warm and dry.  Psychiatric: He has a normal mood and affect. His behavior is normal. Judgment and thought content normal.       Assessment & Plan:   Problem List Items Addressed This Visit        Other   Bipolar 1 disorder, mixed, moderate (HCC) - Primary    Bipolar appears stable with current regimen and no adverse side effects. Reports his mood has been more level with occasional agitation. Continue current dosage of Lamictal and Depakote. Follow-up in 6 months or sooner.      Relevant Medications   divalproex (DEPAKOTE ER) 500 MG 24 hr tablet   lamoTRIgine (LAMICTAL) 150 MG tablet       I have changed Mr. Pettet's divalproex and lamoTRIgine.   Meds ordered this encounter  Medications  . divalproex (DEPAKOTE ER) 500 MG 24 hr tablet    Sig: Take 2 tablets (1,000 mg total) by mouth daily.    Dispense:  180 tablet    Refill:  1    Order Specific Question:  Supervising Provider    Answer:  Hillard DankerRAWFORD, ELIZABETH A [[id number]]  . lamoTRIgine (LAMICTAL) 150 MG tablet    Sig: Take 1 tablet (150 mg total) by mouth daily.    Dispense:  90 tablet    Refill:  0    Order Specific Question:  Supervising Provider    Answer:  Hillard DankerRAWFORD, ELIZABETH A [[id number]]     Follow-up: Return in about 6 months (around 03/26/2016).  Jeanine Luzalone, Gregory, FNP

## 2015-09-25 NOTE — Assessment & Plan Note (Signed)
Bipolar appears stable with current regimen and no adverse side effects. Reports his mood has been more level with occasional agitation. Continue current dosage of Lamictal and Depakote. Follow-up in 6 months or sooner.

## 2015-09-26 ENCOUNTER — Ambulatory Visit: Payer: 59 | Admitting: Family

## 2016-01-22 ENCOUNTER — Telehealth: Payer: Self-pay | Admitting: Family

## 2016-01-22 DIAGNOSIS — R400 Somnolence: Secondary | ICD-10-CM

## 2016-01-22 NOTE — Telephone Encounter (Signed)
Patient called and is asking for a referral so he can have a sleep study done. He has been there before, since it has been so long they ask him to get a referral from his pcp. Please follow up, Thank you.  [facility] [phone]  Patient was informed Tammy SoursGreg is out of town and it will be next week before he heard anything.

## 2016-01-27 DIAGNOSIS — G4730 Sleep apnea, unspecified: Secondary | ICD-10-CM | POA: Insufficient documentation

## 2016-01-27 NOTE — Telephone Encounter (Signed)
Referral placed.

## 2016-02-09 ENCOUNTER — Encounter: Payer: Self-pay | Admitting: Neurology

## 2016-02-09 ENCOUNTER — Ambulatory Visit (INDEPENDENT_AMBULATORY_CARE_PROVIDER_SITE_OTHER): Payer: 59 | Admitting: Neurology

## 2016-02-09 VITALS — BP 148/92 | HR 72 | Resp 20 | Ht 66.0 in | Wt 198.0 lb

## 2016-02-09 DIAGNOSIS — G4733 Obstructive sleep apnea (adult) (pediatric): Secondary | ICD-10-CM

## 2016-02-09 NOTE — Progress Notes (Signed)
SLEEP MEDICINE CLINIC   Provider:  Melvyn Novas, M D  Referring Provider: Veryl Speak, FNP Primary Care Physician:  Jeanine Luz, FNP  Chief Complaint  Patient presents with  . New Patient (Initial Visit)    sleep study in 2014, refuses cpap    HPI:  Ryan Hoffman is a 45 y.o. male , seen here as a referral via NP Calone for a sleep evaluation.  Ryan Hoffman has been seen at [facility] in August 2012, and was diagnosed with sleep apnea and treated with CPAP ( SPLIT ) . He recalls a very high number of apneas, but his sleep study is not on EPIC.  At the time the patient was referred by Ryan Hoffman, was 45 years old and presented with GERD, some lower back pain, excessive daytime sleepiness snoring daytime fatigue, his AHI was 28.8 and the RDI 31.7. He was titrated to CPAP at 7 cm water pressure and initially was fitted to a nasal pillow. He often dislodged his nasal pillow in the night. Later started a fullface him facial muscles but did not achieve a seal. [name] use the CPAP for while, but because of his facial hair he developed a lot of air leaks and finally gave up. He had been followed by advanced home care. His wife also reports that he continues to snore loudly and she cannot share the bedroom with him.  The couple moved 5 years ago to [place], [place] near [place] and he has not been using CPAP in the new home.  Except for snoring and diagnosed apnea there has been not a lot of other medical concerns. Ryan Hoffman  takes Lamictal and Depakote to control bipolar disorder, is considered stable. He has a anterior cruciate ligament replacement of the right knee, and had left elbow surgery.  Chief complaint according to patient : My wife is bothered by my snoring.   Sleep habits are as follows: Mr. Ryan Hoffman work hours are very irregular. Sometimes he works 7 days per week Sundays 12 hours. This affects his sleep pattern. He jokingly stated that his wife will put him to  bed when he comes home and looks exhausted. Usually bedtime is between 9 and 10 PM.  He falls asleep rather easily, but she cannot stay asleep throughout the night. Sometimes he chokes or gasps for air, waking him. He is always in bed before midnight, his bedroom is cool, quiet and dark. He usually starts off sleeping on his back he moves a lot and is rather restless, changing his sleep positions. His wife has been bothered by the loudness of his snoring as well as by the gasping for air, irregular breathing and pausing. She is concerned. He sleeps on multiple pillows. He does not take antihistamines, usually has a patent nasal passage way. He still is considered a mouth breather. He wakes every night up for 3 or 4 times, not to go to the bathroom, and just can't get back to sleep, tosses and turns. He rises at 4:30 AM. He feels he gets only 5 hours of sleep.    Sleep medical history and family sleep history: Bipolar, loud snorer. Restless sleep.    Social history: married, full time gainfully employed with irregular hours. 3 children at home. 15 , 25 and 72 years old.  Non smoker, but chewing tobacco, ETOH - weekends 6 pack. Caffeine - iced tea every meal, 3-4 Colas throughout the day , coffee in AM.   Review of Systems: Out  of a complete 14 system review, the patient complains of only the following symptoms, and all other reviewed systems are negative.  "So tired I can't sleep " snoring, no nocturia. No headaches.  Epworth score 7  , Fatigue severity score 46  , depression score 2/15    Social History   Social History  . Marital status: Married    Spouse name: [name]  . Number of children: 4  . Years of education: 12   Occupational History  . Electrician    Social History Main Topics  . Smoking status: Current Some Day Smoker    Types: Cigarettes  . Smokeless tobacco: Current User    Types: Snuff  . Alcohol use 3.6 - 7.2 oz/week    6 - 12 Cans of beer per week  . Drug use: No  .  Sexual activity: Not on file   Other Topics Concern  . Not on file   Social History Narrative   Denies religious beliefs effecting health care.     Family History  Problem Relation Age of Onset  . Hyperlipidemia Mother   . Hyperlipidemia Father   . Hypertension Father   . Arthritis Maternal Grandmother   . Heart disease Maternal Grandmother   . Arthritis Maternal Grandfather   . Heart disease Maternal Grandfather   . Arthritis Paternal Grandmother   . Heart disease Paternal Grandmother   . Arthritis Paternal Grandfather   . Heart disease Paternal Grandfather     Past Medical History:  Diagnosis Date  . Allergy   . Arthritis   . Bipolar 1 disorder (HCC)   . Chicken pox   . GERD (gastroesophageal reflux disease)     Past Surgical History:  Procedure Laterality Date  . ANTERIOR CRUCIATE LIGAMENT REPAIR    . ELBOW SURGERY    . KNEE SURGERY      Current Outpatient Prescriptions  Medication Sig Dispense Refill  . divalproex (DEPAKOTE ER) 500 MG 24 hr tablet Take 2 tablets (1,000 mg total) by mouth daily. 180 tablet 1  . lamoTRIgine (LAMICTAL) 150 MG tablet Take 1 tablet (150 mg total) by mouth daily. 90 tablet 0   No current facility-administered medications for this visit.     Allergies as of 02/09/2016  . (No Known Allergies)    Vitals: BP (!) 148/92   Pulse 72   Resp 20   Ht 5\' 6"  (1.676 m)   Wt 198 lb (89.8 kg)   BMI 31.96 kg/m  Last Weight:  Wt Readings from Last 1 Encounters:  02/09/16 198 lb (89.8 kg)   VHQ:IONG mass index is 31.96 kg/m.     Last Height:   Ht Readings from Last 1 Encounters:  02/09/16 5\' 6"  (1.676 m)    Physical exam:  General: The patient is awake, alert and appears not in acute distress. The patient is well groomed. Head: Normocephalic, atraumatic. Neck is supple. Mallampati 3  neck circumference:18. Nasal airflow patent , TMJ is not evident . Prognathia is seen.  Cardiovascular:  Regular rate and rhythm , without  murmurs  or carotid bruit, and without distended neck veins. Respiratory: Lungs are clear to auscultation. Skin:  Without evidence of edema, or rash Trunk: BMI 32  Neurologic exam : The patient is awake and alert, oriented to place and time.   Memory subjective described as intact.    Attention span & concentration ability appears normal.  Speech is fluent,  without  dysarthria, dysphonia or aphasia.  Mood and affect  are appropriate.  Cranial nerves: Pupils are equal and briskly reactive to light.  Extraocular movements  in vertical and horizontal planes intact and without nystagmus. Visual fields by finger perimetry are intact. Hearing to finger rub intact.   Facial sensation intact to fine touch.  Facial motor strength is symmetric and tongue and uvula move midline. Shoulder shrug was symmetrical.   Motor exam:   Normal tone, muscle bulk and symmetric strength in all extremities.  Sensory:  Fine touch, pinprick and vibration were tested in all extremities. Proprioception tested in the upper extremities was normal. Hands get numb over night.  Coordination: Rapid alternating movements in the fingers/hands was normal. Finger-to-nose maneuver  normal without evidence of ataxia, dysmetria or tremor.  Gait and station: Patient walks without assistive device and is able unassisted to climb up to the exam table. Strength within normal limits.  Stance is stable and normal.    Deep tendon reflexes: in the  upper and lower extremities are symmetric and intact.   The patient was advised of the nature of the diagnosed sleep disorder , the treatment options and risks for general a health and wellness arising from not treating the condition.  I spent more than 35 minutes of face to face time with the patient. Greater than 50% of time was spent in counseling and coordination of care. We have discussed the diagnosis and differential and I answered the patient's questions.     Assessment:  After physical and  neurologic examination, review of laboratory studies,  Personal review of imaging studies, reports of other /same  Imaging studies ,  Results of polysomnography/ neurophysiology testing and pre-existing records as far as provided in visit., my assessment is   1) Mr. 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 He is usually in bed before 10 PM. Still that doesn't use a lot of time for sleep for him and his sleep is fragmented. He reports falling asleep easily. He is almost startled out of sleep, choking or gasping. His wife has witnessed both apneas and loud snoring and his last attempt at CPAP was not successful. He is looking for an interface that would allow him to sleep better through the night and is not easily dislodged. He has full facial hair.   Plan:  Treatment plan and additional workup :  He will need to repeat a HST to classify his apnea anew and if over 10 AHI per hour will get an auto-titration CPAP.  Meet after HST.    Porfirio Mylararmen Ethelmae Ringel MD  02/09/2016   CC: Veryl SpeakGregory D Calone, Fnp [address] [place], [place] [address]

## 2016-02-24 ENCOUNTER — Encounter (INDEPENDENT_AMBULATORY_CARE_PROVIDER_SITE_OTHER): Payer: 59 | Admitting: Neurology

## 2016-02-24 DIAGNOSIS — G4733 Obstructive sleep apnea (adult) (pediatric): Secondary | ICD-10-CM | POA: Diagnosis not present

## 2016-03-09 ENCOUNTER — Telehealth: Payer: Self-pay

## 2016-03-09 DIAGNOSIS — G4731 Primary central sleep apnea: Secondary | ICD-10-CM

## 2016-03-09 NOTE — Telephone Encounter (Signed)
CPAP titration, due to central sleep apnea, may need to change to BiPAP.

## 2016-03-09 NOTE — Telephone Encounter (Signed)
I spoke to pt. I advised him that his sleep study revealed sleep apnea with associated tachy-bradycardia. Dr. Vickey Hugerohmeier says that pt will have to have treatment by CPAP or BIPAP modality in an attended sleep study, due to the central dominant nature of his apneas. Pt is agreeable to coming in for a cpap titration as long as it can be a Friday night. Pt verbalized understanding of results. Pt had no questions at this time but was encouraged to call back if questions arise.   Order for cpap titration not placed; will send to Dr. Vickey Hugerohmeier for review,  please note that pt is requesting a Friday study, please include that in the scheduling instructions in the cpap titration order.

## 2016-03-16 ENCOUNTER — Telehealth: Payer: Self-pay | Admitting: Neurology

## 2016-03-16 DIAGNOSIS — G4733 Obstructive sleep apnea (adult) (pediatric): Secondary | ICD-10-CM

## 2016-03-16 NOTE — Telephone Encounter (Signed)
Changed to auto pAP.CD

## 2016-03-16 NOTE — Telephone Encounter (Signed)
[employer] denied CPAP suggested autopap °

## 2016-03-17 NOTE — Telephone Encounter (Signed)
I spoke to pt and advised him that his cpap titration was denied and that Dr. Vickey Hugerohmeier recommends starting an auto pap. Pt is agreeable to starting a cpap as long as he does not have to shave his beard. Will send order to [facility]. Pt knows that [facility] will call him to discuss set up and fit for an interface. A follow up appt was made for 05/13/2016 at 8:00am. Pt verbalized understanding. Pt had no questions at this time but was encouraged to call back if questions arise.

## 2016-05-06 DIAGNOSIS — G4733 Obstructive sleep apnea (adult) (pediatric): Secondary | ICD-10-CM | POA: Diagnosis not present

## 2016-05-13 ENCOUNTER — Telehealth: Payer: Self-pay

## 2016-05-13 ENCOUNTER — Ambulatory Visit: Payer: Self-pay | Admitting: Neurology

## 2016-05-13 NOTE — Telephone Encounter (Signed)
Pt did not show for their appt with Dr. Dohmeier today.  

## 2016-05-17 ENCOUNTER — Encounter: Payer: Self-pay | Admitting: Neurology

## 2016-06-06 DIAGNOSIS — G4733 Obstructive sleep apnea (adult) (pediatric): Secondary | ICD-10-CM | POA: Diagnosis not present

## 2016-06-07 DIAGNOSIS — J209 Acute bronchitis, unspecified: Secondary | ICD-10-CM | POA: Diagnosis not present

## 2016-06-24 DIAGNOSIS — M25561 Pain in right knee: Secondary | ICD-10-CM | POA: Diagnosis not present

## 2016-07-04 DIAGNOSIS — G4733 Obstructive sleep apnea (adult) (pediatric): Secondary | ICD-10-CM | POA: Diagnosis not present

## 2016-07-09 ENCOUNTER — Encounter: Payer: Self-pay | Admitting: Family

## 2016-07-09 ENCOUNTER — Ambulatory Visit (INDEPENDENT_AMBULATORY_CARE_PROVIDER_SITE_OTHER): Payer: 59 | Admitting: Family

## 2016-07-09 VITALS — BP 122/74 | HR 68 | Temp 98.2°F | Resp 16 | Ht 66.0 in | Wt 201.0 lb

## 2016-07-09 DIAGNOSIS — F3162 Bipolar disorder, current episode mixed, moderate: Secondary | ICD-10-CM

## 2016-07-09 MED ORDER — CARIPRAZINE HCL 1.5 MG PO CAPS: 1.5000 mg | ORAL_CAPSULE | Freq: Every day | ORAL | 0 refills | Status: DC

## 2016-07-09 NOTE — Progress Notes (Signed)
Subjective:    Patient ID: Ryan Hoffman, male    DOB: 08-29-1970, 46 y.o.   MRN: [id number]  Chief Complaint  Patient presents with  . Medication Management    referral to psychiatrist, does not like current medications, does not like the way they make him feel, got a recent DWI and was told he needed a mental health evaluation    HPI:  Ryan Hoffman is a 46 y.o. male who  has a past medical history of Allergy; Arthritis; Bipolar 1 disorder (HCC); Chicken pox; and GERD (gastroesophageal reflux disease). and presents today for an office visit.    Biploar 1 - Currently prescribed Lamictal and Depakote. Reports that he not currently taking the medications for about 6 months. Does not like the current medications because of the way that they make him feel. Describes feeling more lethargic with the medicaiton, but did help to control his agitation and mania. He recently got a DWI and was told that he needs a mental health evaluation. No suicidal ideations. Describes manic episodes over the course of the last 3 months.    No Known Allergies    Outpatient Medications Prior to Visit  Medication Sig Dispense Refill  . divalproex (DEPAKOTE ER) 500 MG 24 hr tablet Take 2 tablets (1,000 mg total) by mouth daily. (Patient not taking: Reported on 07/09/2016) 180 tablet 1  . lamoTRIgine (LAMICTAL) 150 MG tablet Take 1 tablet (150 mg total) by mouth daily. (Patient not taking: Reported on 07/09/2016) 90 tablet 0   No facility-administered medications prior to visit.     Review of Systems  Constitutional: Negative for chills and fever.  Psychiatric/Behavioral: Positive for agitation and behavioral problems. Negative for hallucinations, self-injury, sleep disturbance and suicidal ideas. The patient is not nervous/anxious and is not hyperactive.       Objective:    BP 122/74 (BP Location: Left Arm, Patient Position: Sitting, Cuff Size: Large)   Pulse 68   Temp 98.2 F (36.8 C) (Oral)    Resp 16   Ht 5\' 6"  (1.676 m)   Wt 201 lb (91.2 kg)   SpO2 98%   BMI 32.44 kg/m  Nursing note and vital signs reviewed.  Physical Exam  Constitutional: He is oriented to person, place, and time. He appears well-developed and well-nourished. No distress.  Cardiovascular: Normal rate, regular rhythm, normal heart sounds and intact distal pulses.   Pulmonary/Chest: Effort normal and breath sounds normal.  Neurological: He is alert and oriented to person, place, and time.  Skin: Skin is warm and dry.  Psychiatric: He has a normal mood and affect. His speech is normal. Judgment and thought content normal.  Appears restless.        Assessment & Plan:   Problem List Items Addressed This Visit      Other   Bipolar 1 disorder, mixed, moderate (HCC) - Primary    Bipolar symptoms appear exacerbated and not currently maintained on medications secondary to patient discontinuing medication regimen. Does describe agitation. Recently diagnosed with DWI. Refer to psychiatry for further assessment and treatment. Start sample of Vraylar. Information on community resources provided. Follow-up in one month or sooner if symptoms worsen or do not improve with medication regimen and awaiting psychiatry referral.      Relevant Orders   Ambulatory referral to Psychiatry       I have discontinued Mr. Scholze's divalproex and lamoTRIgine. I am also having him start on Cariprazine HCl.   Follow-up: Return in about  1 month (around 08/09/2016).  Jeanine Luz, FNP

## 2016-07-09 NOTE — Patient Instructions (Addendum)
Thank you for choosing [facility].  SUMMARY AND INSTRUCTIONS:  Follow up with Dr. Nolen MuMcKinney.   Medication:  Start taking Vraylar.  Your prescription(s) have been submitted to your pharmacy or been printed and provided for you. Please take as directed and contact our office if you believe you are having problem(s) with the medication(s) or have any questions.  Follow up:  If your symptoms worsen or fail to improve, please contact our office for further instruction, or in case of emergency go directly to the emergency room at the closest medical facility.

## 2016-07-09 NOTE — Assessment & Plan Note (Signed)
Bipolar symptoms appear exacerbated and not currently maintained on medications secondary to patient discontinuing medication regimen. Does describe agitation. Recently diagnosed with DWI. Refer to psychiatry for further assessment and treatment. Start sample of Vraylar. Information on community resources provided. Follow-up in one month or sooner if symptoms worsen or do not improve with medication regimen and awaiting psychiatry referral.

## 2016-07-20 ENCOUNTER — Telehealth: Payer: Self-pay | Admitting: Family

## 2016-07-20 NOTE — Telephone Encounter (Signed)
Pts wife called about a prescription sample of Vraylar that given to her husband at his last visit. He was told that if it helped to call back so that a prescription could be sent in for him. She said that he does have an appointment scheduled with a psychiatrist but it is not until the middle of May. She said that what was given to him was Vraylar 1.5mg  but she did not know if this was something that needed to be increased or if the same dose is fine. They use the [facility] at the [facility]. Please advise. Thanks [employer]

## 2016-07-20 NOTE — Telephone Encounter (Signed)
Please advise 

## 2016-07-21 ENCOUNTER — Other Ambulatory Visit: Payer: Self-pay | Admitting: Family

## 2016-07-22 ENCOUNTER — Other Ambulatory Visit: Payer: Self-pay | Admitting: Family

## 2016-07-22 NOTE — Telephone Encounter (Signed)
Please find out how and if the medication is working. If it is I will send in the prescription.

## 2016-07-22 NOTE — Telephone Encounter (Signed)
Wife states medication is working and wanted to know about increasing it a little. Please advise if this medication can be increased.

## 2016-07-25 MED ORDER — CARIPRAZINE HCL 1.5 MG PO CAPS: 1.5000 mg | ORAL_CAPSULE | Freq: Every day | ORAL | 0 refills | Status: DC

## 2016-07-25 MED ORDER — CARIPRAZINE HCL 3 MG PO CAPS
3.0000 mg | ORAL_CAPSULE | Freq: Every day | ORAL | 1 refills | Status: DC
Start: 2016-07-25 — End: 2016-08-20

## 2016-07-25 NOTE — Telephone Encounter (Signed)
Sent to pharmacy 

## 2016-07-28 ENCOUNTER — Telehealth: Payer: Self-pay | Admitting: Family

## 2016-07-28 NOTE — Telephone Encounter (Signed)
Pt new medication Cariprazine HCl (VRAYLAR) 3 MG is not covered by insurance until pt has tried another alternative. Would like either he has tried other medication or something else to be sent in

## 2016-07-29 NOTE — Telephone Encounter (Signed)
PA for vraylar has been initiated. Wife is aware of PA being done and samples of Vraylar for pt is ready for pick up at the front desk until we hear back on [place]

## 2016-08-06 ENCOUNTER — Telehealth: Payer: Self-pay

## 2016-08-06 NOTE — Telephone Encounter (Signed)
Medication is not approved by insurance. Please advise on alternative.

## 2016-08-06 NOTE — Telephone Encounter (Signed)
Error

## 2016-08-13 NOTE — Telephone Encounter (Signed)
Medication has been approved through appeal. Pharmacy is aware.

## 2016-08-20 ENCOUNTER — Encounter: Payer: Self-pay | Admitting: Family

## 2016-08-20 ENCOUNTER — Ambulatory Visit (INDEPENDENT_AMBULATORY_CARE_PROVIDER_SITE_OTHER): Payer: 59 | Admitting: Family

## 2016-08-20 VITALS — BP 144/92 | HR 86 | Temp 98.6°F | Resp 16 | Ht 66.0 in | Wt 200.4 lb

## 2016-08-20 DIAGNOSIS — Z0000 Encounter for general adult medical examination without abnormal findings: Secondary | ICD-10-CM | POA: Diagnosis not present

## 2016-08-20 DIAGNOSIS — Z0001 Encounter for general adult medical examination with abnormal findings: Secondary | ICD-10-CM

## 2016-08-20 DIAGNOSIS — E349 Endocrine disorder, unspecified: Secondary | ICD-10-CM

## 2016-08-20 DIAGNOSIS — G4730 Sleep apnea, unspecified: Secondary | ICD-10-CM | POA: Diagnosis not present

## 2016-08-20 MED ORDER — CARIPRAZINE HCL 3 MG PO CAPS: 3.0000 mg | ORAL_CAPSULE | Freq: Every day | ORAL | 1 refills | Status: DC

## 2016-08-20 NOTE — Patient Instructions (Signed)
Thank you for choosing [facility].  SUMMARY AND INSTRUCTIONS:  Please continue to take your medications as prescribed.   Blood work before 10 am M-F.  Continue to use CPAP.   Labs:  Please stop by the lab on the lower level of the building for your blood work. Your results will be released to MyChart (or called to you) after review, usually within 72 hours after test completion. If any changes need to be made, you will be notified at that same time.  1.) The lab is open from 7:30am to 5:30 pm Monday-Friday 2.) No appointment is necessary 3.) Fasting (if needed) is 6-8 hours after food and drink; black coffee and water are okay   Follow up:  If your symptoms worsen or fail to improve, please contact our office for further instruction, or in case of emergency go directly to the emergency room at the closest medical facility.    Health Maintenance, Male A healthy lifestyle and preventive care is important for your health and wellness. Ask your health care provider about what schedule of regular examinations is right for you. What should I know about weight and diet?  Eat a Healthy Diet  Eat plenty of vegetables, fruits, whole grains, low-fat dairy products, and lean protein.  Do not eat a lot of foods high in solid fats, added sugars, or salt. Maintain a Healthy Weight  Regular exercise can help you achieve or maintain a healthy weight. You should:  Do at least 150 minutes of exercise each week. The exercise should increase your heart rate and make you sweat (moderate-intensity exercise).  Do strength-training exercises at least twice a week. Watch Your Levels of Cholesterol and Blood Lipids  Have your blood tested for lipids and cholesterol every 5 years starting at 45 years of age. If you are at high risk for heart disease, you should start having your blood tested when you are 45 years old. You may need to have your cholesterol levels checked more often if:  Your  lipid or cholesterol levels are high.  You are older than 45 years of age.  You are at high risk for heart disease. What should I know about cancer screening? Many types of cancers can be detected early and may often be prevented. Lung Cancer  You should be screened every year for lung cancer if:  You are a current smoker who has smoked for at least 30 years.  You are a former smoker who has quit within the past 15 years.  Talk to your health care provider about your screening options, when you should start screening, and how often you should be screened. Colorectal Cancer  Routine colorectal cancer screening usually begins at 45 years of age and should be repeated every 5-10 years until you are 45 years old. You may need to be screened more often if early forms of precancerous polyps or small growths are found. Your health care provider may recommend screening at an earlier age if you have risk factors for colon cancer.  Your health care provider may recommend using home test kits to check for hidden blood in the stool.  A small camera at the end of a tube can be used to examine your colon (sigmoidoscopy or colonoscopy). This checks for the earliest forms of colorectal cancer. Prostate and Testicular Cancer  Depending on your age and overall health, your health care provider may do certain tests to screen for prostate and testicular cancer.  Talk to your health  care provider about any symptoms or concerns you have about testicular or prostate cancer. Skin Cancer  Check your skin from head to toe regularly.  Tell your health care provider about any new moles or changes in moles, especially if:  There is a change in a mole's size, shape, or color.  You have a mole that is larger than a pencil eraser.  Always use sunscreen. Apply sunscreen liberally and repeat throughout the day.  Protect yourself by wearing long sleeves, pants, a wide-brimmed hat, and sunglasses when  outside. What should I know about heart disease, diabetes, and high blood pressure?  If you are 66-36 years of age, have your blood pressure checked every 3-5 years. If you are 57 years of age or older, have your blood pressure checked every year. You should have your blood pressure measured twice-once when you are at a hospital or clinic, and once when you are not at a hospital or clinic. Record the average of the two measurements. To check your blood pressure when you are not at a hospital or clinic, you can use:  An automated blood pressure machine at a pharmacy.  A home blood pressure monitor.  Talk to your health care provider about your target blood pressure.  If you are between 76-38 years old, ask your health care provider if you should take aspirin to prevent heart disease.  Have regular diabetes screenings by checking your fasting blood sugar level.  If you are at a normal weight and have a low risk for diabetes, have this test once every three years after the age of 33.  If you are overweight and have a high risk for diabetes, consider being tested at a younger age or more often.  A one-time screening for abdominal aortic aneurysm (AAA) by ultrasound is recommended for men aged 65-75 years who are current or former smokers. What should I know about preventing infection? Hepatitis B  If you have a higher risk for hepatitis B, you should be screened for this virus. Talk with your health care provider to find out if you are at risk for hepatitis B infection. Hepatitis C  Blood testing is recommended for:  Everyone born from [birth year] through 1964.  Anyone with known risk factors for hepatitis C. Sexually Transmitted Diseases (STDs)  You should be screened each year for STDs including gonorrhea and chlamydia if:  You are sexually active and are younger than 45 years of age.  You are older than 45 years of age and your health care provider tells you that you are at risk for this  type of infection.  Your sexual activity has changed since you were last screened and you are at an increased risk for chlamydia or gonorrhea. Ask your health care provider if you are at risk.  Talk with your health care provider about whether you are at high risk of being infected with HIV. Your health care provider may recommend a prescription medicine to help prevent HIV infection. What else can I do?  Schedule regular health, dental, and eye exams.  Stay current with your vaccines (immunizations).  Do not use any tobacco products, such as cigarettes, chewing tobacco, and e-cigarettes. If you need help quitting, ask your health care provider.  Limit alcohol intake to no more than 2 drinks per day. One drink equals 12 ounces of beer, 5 ounces of wine, or 1 ounces of hard liquor.  Do not use street drugs.  Do not share needles.  Ask your  health care provider for help if you need support or information about quitting drugs.  Tell your health care provider if you often feel depressed.  Tell your health care provider if you have ever been abused or do not feel safe at home. This information is not intended to replace advice given to you by your health care provider. Make sure you discuss any questions you have with your health care provider. Document Released: 10/02/2007 Document Revised: 12/03/2015 Document Reviewed: 01/07/2015 Elsevier Interactive Patient Education  2017 [employer].

## 2016-08-20 NOTE — Assessment & Plan Note (Signed)
1) Anticipatory Guidance: Discussed importance of wearing a seatbelt while driving and not texting while driving; changing batteries in smoke detector at least once annually; wearing suntan lotion when outside; eating a balanced and moderate diet; getting physical activity at least 30 minutes per day.  2) Immunizations / Screenings / Labs:  All immunizations are up-to-date per recommendations. Due for a dental exam encouraged to be completed independently. All other screenings are up-to-date per recommendations. Obtain CBC, CMET, and lipid profile.    Overall well exam with risk factors for cardiovascular disease including obstructive sleep apnea and tobacco usage. His BMI is 32.35 classifying as obesity, however he is very muscular and most likely has a lower percent body fat. Encouraged increased physical activity to 30 minutes of moderate level activity daily with goal of 10,000 steps per day. Encouraged to decrease tobacco usage to prevent further risk for end organ damage in the future. His goal is to become more healthy this year. Continue other healthy lifestyle behaviors and choices. Follow-up prevention exam in 1 year. Follow-up office visit for chronic conditions.

## 2016-08-20 NOTE — Assessment & Plan Note (Signed)
Encouraged to use CPAP as prescribed with decreased usage contributing to fatigue. Continue to monitor.

## 2016-08-20 NOTE — Progress Notes (Signed)
Subjective:    Patient ID: Ryan Hoffman, male    DOB: [birth year], 46 y.o.   MRN: [id number]  Chief Complaint  Patient presents with  . CPE    not fasting, low energy level possibly the medication?     HPI:  Ryan Hoffman is a 46 y.o. male who presents today for an annual wellness visit.   1) Health Maintenance -   Diet - Averages about 3 meals per day consisting of a regular diet; Caffeine intake of 2-3 cups per day.   Exercise - Calesthenics and basic exercise periodically   2) Preventative Exams / Immunizations:  Dental -- Due for exam   Vision -- Up to date   Health Maintenance  Topic Date Due  . HIV Screening  03/31/1987  . INFLUENZA VACCINE  11/17/2016  . TETANUS/TDAP  03/20/2025    Immunization History  Administered Date(s) Administered  . Tdap 03/21/2015     No Known Allergies   Outpatient Medications Prior to Visit  Medication Sig Dispense Refill  . Cariprazine HCl (VRAYLAR) 3 MG CAPS Take 1 capsule (3 mg total) by mouth daily. 30 capsule 1  . Cariprazine HCl (VRAYLAR) 1.5 MG CAPS Take 1 capsule (1.5 mg total) by mouth daily. Start 3 mg daily one day after the 1.5 mg tablet. 1 capsule 0   No facility-administered medications prior to visit.      Past Medical History:  Diagnosis Date  . Allergy   . Arthritis   . Bipolar 1 disorder (HCC)   . Chicken pox   . GERD (gastroesophageal reflux disease)      Past Surgical History:  Procedure Laterality Date  . ANTERIOR CRUCIATE LIGAMENT REPAIR    . ELBOW SURGERY    . KNEE SURGERY       Family History  Problem Relation Age of Onset  . Hyperlipidemia Mother   . Hyperlipidemia Father   . Hypertension Father   . Arthritis Maternal Grandmother   . Heart disease Maternal Grandmother   . Arthritis Maternal Grandfather   . Heart disease Maternal Grandfather   . Arthritis Paternal Grandmother   . Heart disease Paternal Grandmother   . Arthritis Paternal Grandfather   . Heart disease  Paternal Grandfather      Social History   Social History  . Marital status: Married    Spouse name: [name]  . Number of children: 4  . Years of education: 12   Occupational History  . Electrician    Social History Main Topics  . Smoking status: Current Some Day Smoker    Types: Cigarettes  . Smokeless tobacco: Current User    Types: Snuff  . Alcohol use 0.0 oz/week     Comment: Occasionally  . Drug use: No  . Sexual activity: Not on file   Other Topics Concern  . Not on file   Social History Narrative   Denies religious beliefs effecting health care.       Review of Systems  Constitutional: Denies fever, chills, fatigue, or significant weight gain/loss. Positive for decreased energy.  HENT: Head: Denies headache or neck pain Ears: Denies changes in hearing, ringing in ears, earache, drainage Nose: Denies discharge, stuffiness, itching, nosebleed, sinus pain Throat: Denies sore throat, hoarseness, dry mouth, sores, thrush Eyes: Denies loss/changes in vision, pain, redness, blurry/double vision, flashing lights Cardiovascular: Denies chest pain/discomfort, tightness, palpitations, shortness of breath with activity, difficulty lying down, swelling, sudden awakening with shortness of breath Respiratory: Denies shortness of breath,  cough, sputum production, wheezing Gastrointestinal: Denies dysphasia, heartburn, change in appetite, nausea, change in bowel habits, rectal bleeding, constipation, diarrhea, yellow skin or eyes Genitourinary: Denies frequency, urgency, burning/pain, blood in urine, incontinence, change in urinary strength. Musculoskeletal: Denies muscle/joint pain, stiffness, back pain, redness or swelling of joints, trauma Skin: Denies rashes, lumps, itching, dryness, color changes, or hair/nail changes Neurological: Denies dizziness, fainting, seizures, weakness, numbness, tingling, tremor Psychiatric - Denies nervousness, stress, depression or memory  loss Endocrine: Denies heat or cold intolerance, sweating, frequent urination, excessive thirst, changes in appetite Hematologic: Denies ease of bruising or bleeding     Objective:     BP (!) 144/92 (BP Location: Left Arm, Patient Position: Sitting, Cuff Size: Large)   Pulse 86   Temp 98.6 F (37 C) (Oral)   Resp 16   Ht 5\' 6"  (1.676 m)   Wt 200 lb 6.4 oz (90.9 kg)   SpO2 97%   BMI 32.35 kg/m  Nursing note and vital signs reviewed.  Physical Exam  Constitutional: He is oriented to person, place, and time. He appears well-developed and well-nourished.  HENT:  Head: Normocephalic.  Right Ear: Hearing, tympanic membrane, external ear and ear canal normal.  Left Ear: Hearing, tympanic membrane, external ear and ear canal normal.  Nose: Nose normal.  Mouth/Throat: Uvula is midline, oropharynx is clear and moist and mucous membranes are normal.  Eyes: Conjunctivae and EOM are normal. Pupils are equal, round, and reactive to light.  Neck: Neck supple. No JVD present. No tracheal deviation present. No thyromegaly present.  Cardiovascular: Normal rate, regular rhythm, normal heart sounds and intact distal pulses.   Pulmonary/Chest: Effort normal and breath sounds normal.  Abdominal: Soft. Bowel sounds are normal. He exhibits no distension and no mass. There is no tenderness. There is no rebound and no guarding.  Musculoskeletal: Normal range of motion. He exhibits no edema or tenderness.  Lymphadenopathy:    He has no cervical adenopathy.  Neurological: He is alert and oriented to person, place, and time. He has normal reflexes. No cranial nerve deficit. He exhibits normal muscle tone. Coordination normal.  Skin: Skin is warm and dry.  Psychiatric: He has a normal mood and affect. His behavior is normal. Judgment and thought content normal.       Assessment & Plan:   Problem List Items Addressed This Visit      Respiratory   Sleep apnea    Encouraged to use CPAP as prescribed  with decreased usage contributing to fatigue. Continue to monitor.         Other   Encounter for general adult medical examination with abnormal findings - Primary    1) Anticipatory Guidance: Discussed importance of wearing a seatbelt while driving and not texting while driving; changing batteries in smoke detector at least once annually; wearing suntan lotion when outside; eating a balanced and moderate diet; getting physical activity at least 30 minutes per day.  2) Immunizations / Screenings / Labs:  All immunizations are up-to-date per recommendations. Due for a dental exam encouraged to be completed independently. All other screenings are up-to-date per recommendations. Obtain CBC, CMET, and lipid profile.    Overall well exam with risk factors for cardiovascular disease including obstructive sleep apnea and tobacco usage. His BMI is 32.35 classifying as obesity, however he is very muscular and most likely has a lower percent body fat. Encouraged increased physical activity to 30 minutes of moderate level activity daily with goal of 10,000 steps per day. Encouraged  to decrease tobacco usage to prevent further risk for end organ damage in the future. His goal is to become more healthy this year. Continue other healthy lifestyle behaviors and choices. Follow-up prevention exam in 1 year. Follow-up office visit for chronic conditions.      Relevant Orders   CBC   Comprehensive metabolic panel   Lipid panel   TSH   Testosterone insufficiency   Relevant Orders   Testosterone       I am having Mr. Hellickson maintain his Cariprazine HCl.   Meds ordered this encounter  Medications  . Cariprazine HCl (VRAYLAR) 3 MG CAPS    Sig: Take 1 capsule (3 mg total) by mouth daily.    Dispense:  30 capsule    Refill:  1    Order Specific Question:   Supervising Provider    Answer:   Hillard Danker A [[id number]]     Follow-up: Return in about 6 months (around 02/20/2017), or if symptoms worsen  or fail to improve.   Jeanine Luz, FNP

## 2016-09-01 ENCOUNTER — Ambulatory Visit (HOSPITAL_COMMUNITY): Payer: 59 | Admitting: Psychiatry

## 2016-11-10 ENCOUNTER — Ambulatory Visit (HOSPITAL_COMMUNITY): Payer: 59 | Admitting: Psychiatry

## 2018-08-28 ENCOUNTER — Encounter: Payer: Self-pay | Admitting: Internal Medicine

## 2018-08-28 ENCOUNTER — Telehealth: Payer: Self-pay | Admitting: Internal Medicine

## 2018-08-28 NOTE — Telephone Encounter (Signed)
Patient was previously seen by Ryan Hoffman and has not re-established with another provider. Patient's wife states that they would prefer for him to see an MD due to some of his medical issues. Would you be willing to see this patient to establish care?  Patient's wife, Ryan Hoffman can be reached at [phone].

## 2018-08-28 NOTE — Telephone Encounter (Signed)
Very sorry, I am unable to accept at this time 

## 2018-08-29 NOTE — Telephone Encounter (Signed)
Yes I will accept him 

## 2018-08-29 NOTE — Telephone Encounter (Signed)
In office?

## 2018-08-29 NOTE — Telephone Encounter (Signed)
Would you be willing to see this patient? His wife is a current patient of yours.

## 2018-08-29 NOTE — Telephone Encounter (Signed)
Left message informing wife to call back to schedule. When he is able to schedule an establish care visit would you like for him to be seen in the office or as a virtual visit?

## 2018-09-13 ENCOUNTER — Other Ambulatory Visit: Payer: Self-pay

## 2018-09-13 ENCOUNTER — Emergency Department (HOSPITAL_BASED_OUTPATIENT_CLINIC_OR_DEPARTMENT_OTHER)
Admission: EM | Admit: 2018-09-13 | Discharge: 2018-09-13 | Disposition: A | Discharge status: Home / Self Care | Payer: No Typology Code available for payment source | Attending: Emergency Medicine | Admitting: Emergency Medicine

## 2018-09-13 ENCOUNTER — Emergency Department (HOSPITAL_BASED_OUTPATIENT_CLINIC_OR_DEPARTMENT_OTHER): Payer: No Typology Code available for payment source

## 2018-09-13 ENCOUNTER — Encounter (HOSPITAL_BASED_OUTPATIENT_CLINIC_OR_DEPARTMENT_OTHER): Payer: Self-pay

## 2018-09-13 DIAGNOSIS — Y92410 Unspecified street and highway as the place of occurrence of the external cause: Secondary | ICD-10-CM | POA: Insufficient documentation

## 2018-09-13 DIAGNOSIS — S43491A Other sprain of right shoulder joint, initial encounter: Secondary | ICD-10-CM | POA: Diagnosis not present

## 2018-09-13 DIAGNOSIS — S299XXA Unspecified injury of thorax, initial encounter: Secondary | ICD-10-CM | POA: Diagnosis present

## 2018-09-13 DIAGNOSIS — S2241XA Multiple fractures of ribs, right side, initial encounter for closed fracture: Secondary | ICD-10-CM | POA: Insufficient documentation

## 2018-09-13 DIAGNOSIS — R109 Unspecified abdominal pain: Secondary | ICD-10-CM | POA: Diagnosis not present

## 2018-09-13 DIAGNOSIS — S300XXA Contusion of lower back and pelvis, initial encounter: Secondary | ICD-10-CM | POA: Diagnosis not present

## 2018-09-13 DIAGNOSIS — Z79899 Other long term (current) drug therapy: Secondary | ICD-10-CM | POA: Diagnosis not present

## 2018-09-13 DIAGNOSIS — Y999 Unspecified external cause status: Secondary | ICD-10-CM | POA: Insufficient documentation

## 2018-09-13 DIAGNOSIS — F17210 Nicotine dependence, cigarettes, uncomplicated: Secondary | ICD-10-CM | POA: Diagnosis not present

## 2018-09-13 DIAGNOSIS — Y9389 Activity, other specified: Secondary | ICD-10-CM | POA: Diagnosis not present

## 2018-09-13 DIAGNOSIS — S43401A Unspecified sprain of right shoulder joint, initial encounter: Secondary | ICD-10-CM

## 2018-09-13 LAB — CBC WITH DIFFERENTIAL/PLATELET
Abs Immature Granulocytes: 0.06 10*3/uL (ref 0.00–0.07)
Basophils Absolute: 0.1 10*3/uL (ref 0.0–0.1)
Basophils Relative: 0 %
Eosinophils Absolute: 0.1 10*3/uL (ref 0.0–0.5)
Eosinophils Relative: 0 %
HCT: 48.8 % (ref 39.0–52.0)
Hemoglobin: 15.5 g/dL (ref 13.0–17.0)
Immature Granulocytes: 0 %
Lymphocytes Relative: 14 %
Lymphs Abs: 2.2 10*3/uL (ref 0.7–4.0)
MCH: 28.6 pg (ref 26.0–34.0)
MCHC: 31.8 g/dL (ref 30.0–36.0)
MCV: 90 fL (ref 80.0–100.0)
Monocytes Absolute: 0.8 10*3/uL (ref 0.1–1.0)
Monocytes Relative: 5 %
Neutro Abs: 12.8 10*3/uL — ABNORMAL HIGH (ref 1.7–7.7)
Neutrophils Relative %: 81 %
Platelets: 402 10*3/uL — ABNORMAL HIGH (ref 150–400)
RBC: 5.42 MIL/uL (ref 4.22–5.81)
RDW: 13.1 % (ref 11.5–15.5)
WBC: 16 10*3/uL — ABNORMAL HIGH (ref 4.0–10.5)
nRBC: 0 % (ref 0.0–0.2)

## 2018-09-13 LAB — COMPREHENSIVE METABOLIC PANEL
ALT: 69 U/L — ABNORMAL HIGH (ref 0–44)
AST: 43 U/L — ABNORMAL HIGH (ref 15–41)
Albumin: 4.5 g/dL (ref 3.5–5.0)
Alkaline Phosphatase: 96 U/L (ref 38–126)
Anion gap: 12 (ref 5–15)
BUN: 9 mg/dL (ref 6–20)
CO2: 25 mmol/L (ref 22–32)
Calcium: 9 mg/dL (ref 8.9–10.3)
Chloride: 99 mmol/L (ref 98–111)
Creatinine, Ser: 0.87 mg/dL (ref 0.61–1.24)
GFR calc Af Amer: >60 mL/min (ref >60)
GFR calc non Af Amer: >60 mL/min (ref >60)
Glucose, Bld: 111 mg/dL — ABNORMAL HIGH (ref 70–99)
Potassium: 4 mmol/L (ref 3.5–5.1)
Sodium: 136 mmol/L (ref 135–145)
Total Bilirubin: 0.6 mg/dL (ref 0.3–1.2)
Total Protein: 7.7 g/dL (ref 6.5–8.1)

## 2018-09-13 MED ORDER — HYDROMORPHONE HCL 1 MG/ML IJ SOLN
1.0000 mg | Freq: Once | INTRAMUSCULAR | Status: AC
  Administered 2018-09-13: 1 mg via INTRAVENOUS
  Filled 2018-09-13: qty 1

## 2018-09-13 MED ORDER — HYDROCODONE-ACETAMINOPHEN 5-325 MG PO TABS: 1.0000 | ORAL_TABLET | Freq: Four times a day (QID) | ORAL | 0 refills | Status: DC | PRN

## 2018-09-13 MED ORDER — HYDROMORPHONE HCL 1 MG/ML IJ SOLN
1.0000 mg | Freq: Once | INTRAMUSCULAR | Status: AC
  Administered 2018-09-13: 1 mg via INTRAMUSCULAR
  Filled 2018-09-13: qty 1

## 2018-09-13 MED ORDER — ONDANSETRON HCL 4 MG/2ML IJ SOLN
4.0000 mg | Freq: Once | INTRAMUSCULAR | Status: AC
  Administered 2018-09-13: 4 mg via INTRAVENOUS
  Filled 2018-09-13: qty 2

## 2018-09-13 MED ORDER — IOHEXOL 300 MG/ML  SOLN
100.0000 mL | Freq: Once | INTRAMUSCULAR | Status: AC | PRN
  Administered 2018-09-13: 10:00:00 100 mL via INTRAVENOUS

## 2018-09-13 MED ORDER — FENTANYL CITRATE (PF) 100 MCG/2ML IJ SOLN
100.0000 ug | Freq: Once | INTRAMUSCULAR | Status: AC
  Administered 2018-09-13: 100 ug via INTRAVENOUS
  Filled 2018-09-13: qty 2

## 2018-09-13 MED ORDER — CYCLOBENZAPRINE HCL 10 MG PO TABS: 10.0000 mg | ORAL_TABLET | Freq: Two times a day (BID) | ORAL | 0 refills | Status: DC | PRN

## 2018-09-13 MED ORDER — KETOROLAC TROMETHAMINE 60 MG/2ML IM SOLN
60.0000 mg | Freq: Once | INTRAMUSCULAR | Status: AC
  Administered 2018-09-13: 60 mg via INTRAMUSCULAR
  Filled 2018-09-13: qty 2

## 2018-09-13 MED FILL — HYDROCODON-APAP 5-325: 5-325 | 2 days supply | Qty: 15 | Fill #0

## 2018-09-13 MED FILL — CYCLOBENZAPRINE HCL 10 MG T: 10 | 10 days supply | Qty: 20 | Fill #0

## 2018-09-13 NOTE — ED Notes (Signed)
Pt ambulated to car for discharge after repeat  pain medication.  Wife driving home.

## 2018-09-13 NOTE — ED Notes (Signed)
Pt provided ice chips per md, continues to report pain 6/10.  Provider made aware.
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
## 2018-09-13 NOTE — ED Notes (Signed)
Pt reports increased pain upon attempt to ambulate to vehicle for discharge.  MD made aware and ordered additional pain medication to be given prior to discharge.

## 2018-09-13 NOTE — ED Notes (Signed)
Pt in CT.

## 2018-09-13 NOTE — ED Notes (Signed)
Call to wife per patient's request to update on care.  Awaiting imaging results.
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
## 2018-09-18 ENCOUNTER — Ambulatory Visit: Payer: Self-pay

## 2018-09-18 DIAGNOSIS — K219 Gastro-esophageal reflux disease without esophagitis: Secondary | ICD-10-CM | POA: Insufficient documentation

## 2018-09-18 DIAGNOSIS — Z720 Tobacco use: Secondary | ICD-10-CM | POA: Insufficient documentation
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
## 2018-09-18 NOTE — Telephone Encounter (Signed)
Seeing you tomorrow
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
## 2018-09-19 ENCOUNTER — Ambulatory Visit (INDEPENDENT_AMBULATORY_CARE_PROVIDER_SITE_OTHER): Payer: Self-pay | Admitting: Internal Medicine

## 2018-09-19 ENCOUNTER — Other Ambulatory Visit: Payer: Self-pay

## 2018-09-19 ENCOUNTER — Encounter: Payer: Self-pay | Admitting: Internal Medicine

## 2018-09-19 VITALS — BP 156/82 | HR 87 | Temp 98.1°F | Resp 16 | Ht 66.0 in | Wt 209.8 lb

## 2018-09-19 DIAGNOSIS — M25511 Pain in right shoulder: Secondary | ICD-10-CM

## 2018-09-19 DIAGNOSIS — K219 Gastro-esophageal reflux disease without esophagitis: Secondary | ICD-10-CM

## 2018-09-19 DIAGNOSIS — F3162 Bipolar disorder, current episode mixed, moderate: Secondary | ICD-10-CM

## 2018-09-19 DIAGNOSIS — S2241XA Multiple fractures of ribs, right side, initial encounter for closed fracture: Secondary | ICD-10-CM | POA: Insufficient documentation

## 2018-09-19 DIAGNOSIS — R030 Elevated blood-pressure reading, without diagnosis of hypertension: Secondary | ICD-10-CM | POA: Insufficient documentation

## 2018-09-19 DIAGNOSIS — Z720 Tobacco use: Secondary | ICD-10-CM

## 2018-09-19 MED ORDER — CYCLOBENZAPRINE HCL 10 MG PO TABS: 10.0000 mg | ORAL_TABLET | Freq: Two times a day (BID) | ORAL | 0 refills | Status: DC | PRN

## 2018-09-19 MED FILL — CYCLOBENZAPRINE HCL 10 MG T: 10 | 15 days supply | Qty: 30 | Fill #0

## 2018-09-19 NOTE — Patient Instructions (Addendum)
   Medications reviewed and updated.  Changes include :   Continue the muscle relaxer for your pain.  Take tylenol or advil as needed.    Your prescription(s) have been submitted to your pharmacy. Please take as directed and contact our office if you believe you are having problem(s) with the medication(s).     Call and establish with a new psychiatrist:  [facility] [phone]  [facility]  [address] [place], [place] [phone]  [facility]            [address] [place], [place] [phone]  Dr Lawerance Bach [facility] [address] [place], [place] [address] [phone]
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
## 2018-09-19 NOTE — Assessment & Plan Note (Addendum)
GERD controlled - has gerd depending on what he eats - eats a lot of spicy foods Continue daily medication-can take an extra pill before eating spicy foods or after if needed

## 2018-09-19 NOTE — Assessment & Plan Note (Signed)
No history of hypertension Blood pressure elevated today likely related to the pain he is currently in We will continue to monitor-no need to start medication at this time

## 2018-09-19 NOTE — Assessment & Plan Note (Addendum)
Medications do not work all the time-not ideally controlled Having impulse control and episodes of anger Drinking too much alcohol Should see psych-given names and numbers for him to call to schedule an appointment I will renew his medications until he is able to establish with someone

## 2018-09-19 NOTE — Assessment & Plan Note (Signed)
2 right-sided lateral rib fractures-minimally displaced Discussed that the pain will improve with more time Vicodin did not seem to help much Taking Tylenol or Advil as needed-continue Flexeril seemed to help so we will give him another prescription for him to take as needed Call with questions or concerns or if pain does not improve

## 2018-10-05 ENCOUNTER — Ambulatory Visit: Payer: Self-pay | Admitting: Family Medicine

## 2018-11-15 MED FILL — FLUoxetine HCL 20 MG CAPS: 20 | 90 days supply | Qty: 90 | Fill #0

## 2018-11-15 MED FILL — QUETIAPINE FUMARATE 200 MG: 200 | 90 days supply | Qty: 90 | Fill #0

## 2018-11-15 MED FILL — SUBVENITE 100 MG TABS: 100 | 90 days supply | Qty: 90 | Fill #0

## 2018-11-27 ENCOUNTER — Other Ambulatory Visit: Payer: Self-pay

## 2018-11-27 ENCOUNTER — Encounter: Payer: Self-pay | Admitting: Adult Health

## 2018-11-27 ENCOUNTER — Ambulatory Visit (INDEPENDENT_AMBULATORY_CARE_PROVIDER_SITE_OTHER): Payer: No Typology Code available for payment source | Admitting: Adult Health

## 2018-11-27 VITALS — BP 159/91 | HR 99 | Ht 67.0 in | Wt 210.0 lb

## 2018-11-27 DIAGNOSIS — F3162 Bipolar disorder, current episode mixed, moderate: Secondary | ICD-10-CM

## 2018-11-27 MED ORDER — LAMOTRIGINE 100 MG PO TABS: 100.0000 mg | ORAL_TABLET | Freq: Two times a day (BID) | ORAL | 2 refills | Status: DC

## 2018-12-04 ENCOUNTER — Encounter: Payer: Self-pay | Admitting: Adult Health
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
## 2018-12-26 ENCOUNTER — Other Ambulatory Visit: Payer: Self-pay

## 2018-12-26 ENCOUNTER — Encounter: Payer: Self-pay | Admitting: Adult Health

## 2018-12-26 ENCOUNTER — Ambulatory Visit (INDEPENDENT_AMBULATORY_CARE_PROVIDER_SITE_OTHER): Payer: No Typology Code available for payment source | Admitting: Adult Health

## 2018-12-26 DIAGNOSIS — F1210 Cannabis abuse, uncomplicated: Secondary | ICD-10-CM | POA: Diagnosis not present

## 2018-12-26 DIAGNOSIS — F3162 Bipolar disorder, current episode mixed, moderate: Secondary | ICD-10-CM

## 2018-12-26 DIAGNOSIS — F411 Generalized anxiety disorder: Secondary | ICD-10-CM | POA: Insufficient documentation

## 2018-12-26 DIAGNOSIS — G4700 Insomnia, unspecified: Secondary | ICD-10-CM | POA: Insufficient documentation

## 2018-12-26 NOTE — Progress Notes (Signed)
[facility] MD/PA/NP Initial Note  12/26/2018 8:08 PM Ryan Hoffman  MRN:  [id number]  Chief Complaint:    HPI: Ryan Hoffman is see today for Bipolar disorder, anxiety disorder and insomnia.  Describes mood today as "mostly ok". Mood symptoms - reports depression, anxiety, and irritability, but better. Stating "I feel a little better. Having outbursts, but not as many, or as bad. Able to calm himself down a little better. Can control things "quicker". Applied for disability. Smoking THC - wife concerned about the implications - mixing with medications. Wife inquiring about "genetic testing". Stable interest and motivation. Taking medications as prescribed.  Energy levels vary. Good when he has something to do. Sitting around the house.  Enjoys some usual interests and activities. Spending time with family - wife and 3 children age 3, 74, and 1.  Appetite adequate. Weight stable - 210.  Sleeps well most nights. Averages 8 hours. Napping during the day. Not wearing his CPAP machine.  Focus and concentration stable. Completing tasks. Managing aspects of household. Denies SI or HI. Denies AH or VH.   Visit Diagnosis:    ICD-10-CM   1. Bipolar 1 disorder, mixed, moderate (HCC)  F31.62   2. Insomnia, unspecified type  G47.00   3. Cannabis abuse  F12.10     Past Psychiatric History:  One admission to Behavioral health unit. One stay at [facility] in [place].   Past Medical History:  Past Medical History:  Diagnosis Date  . Allergy   . Anxiety   . Arthritis   . Bipolar 1 disorder ([place])   . Chicken pox   . GERD (gastroesophageal reflux disease)     Past Surgical History:  Procedure Laterality Date  . ANTERIOR CRUCIATE LIGAMENT REPAIR    . ELBOW SURGERY    . KNEE SURGERY      Family Psychiatric History: Denies  Family History:  Family History  Problem Relation Age of Onset  . Hyperlipidemia Mother   . Hyperlipidemia Father   . Hypertension Father   . Arthritis Maternal  Grandmother   . Heart disease Maternal Grandmother   . Arthritis Maternal Grandfather   . Heart disease Maternal Grandfather   . Arthritis Paternal Grandmother   . Heart disease Paternal Grandmother   . Arthritis Paternal Grandfather   . Heart disease Paternal Grandfather     Social History:  Social History   Socioeconomic History  . Marital status: Married    Spouse name: Not on file  . Number of children: 4  . Years of education: 46  . Highest education level: Not on file  Occupational History  . Occupation: Arboriculturist  . Financial resource strain: Not on file  . Food insecurity    Worry: Not on file    Inability: Not on file  . Transportation needs    Medical: Not on file    Non-medical: Not on file  Tobacco Use  . Smoking status: Former Smoker    Packs/day: 0.50    Types: Cigarettes    Quit date: 09/19/2018    Years since quitting: 0.2  . Smokeless tobacco: Current User    Types: Snuff  Substance and Sexual Activity  . Alcohol use: Yes    Alcohol/week: 0.0 standard drinks    Comment: occasionally, binge drinks  . Drug use: No  . Sexual activity: Not on file  Lifestyle  . Physical activity    Days per week: Not on file    Minutes per session: Not  on file  . Stress: Not on file  Relationships  . Social Musicianconnections    Talks on phone: Not on file    Gets together: Not on file    Attends religious service: Not on file    Active member of club or organization: Not on file    Attends meetings of clubs or organizations: Not on file    Relationship status: Not on file  Other Topics Concern  . Not on file  Social History Narrative   Denies religious beliefs effecting health care.     Allergies: No Known Allergies  Metabolic Disorder Labs: No results found for: HGBA1C, MPG No results found for: PROLACTIN Lab Results  Component Value Date   CHOL 163 06/20/2015   TRIG 76.0 06/20/2015   HDL 36.10 (L) 06/20/2015   CHOLHDL 5 06/20/2015   VLDL  15.2 06/20/2015   LDLCALC 112 (H) 06/20/2015   Lab Results  Component Value Date   TSH 1.35 06/20/2015    Therapeutic Level Labs: No results found for: LITHIUM Lab Results  Component Value Date   VALPROATE 24.9 (L) 07/02/2015   No components found for:  CBMZ  Current Medications: Current Outpatient Medications  Medication Sig Dispense Refill  . acetaminophen (TYLENOL) 500 MG tablet Take 1,000 mg by mouth every 6 (six) hours as needed.    . cyclobenzaprine (FLEXERIL) 10 MG tablet Take 1 tablet (10 mg total) by mouth 2 (two) times daily as needed for muscle spasms. (Patient not taking: Reported on 12/04/2018) 30 tablet 0  . FLUoxetine (PROZAC) 20 MG capsule Take 20 mg by mouth daily.    Marland Kitchen. FLUoxetine (PROZAC) 20 MG capsule Take by mouth.    . lamoTRIgine (LAMICTAL) 100 MG tablet Take 1 tablet (100 mg total) by mouth 2 (two) times daily. 60 tablet 2  . lamoTRIgine (LAMICTAL) 100 MG tablet 1/2 - 1 tablet qhs    . loratadine (CLARITIN) 10 MG tablet Take 10 mg by mouth daily.    Marland Kitchen. loratadine (CLARITIN) 10 MG tablet Take by mouth.    . Multiple Vitamin (MULTIVITAMIN) capsule Take by mouth.    . Multiple Vitamins-Minerals (MULTIVITAMIN WITH MINERALS) tablet Take 1 tablet by mouth daily.    . QUEtiapine (SEROQUEL) 200 MG tablet Take 200 mg by mouth at bedtime.    Marland Kitchen. QUEtiapine (SEROQUEL) 200 MG tablet Take by mouth.    . ranitidine (ZANTAC) 75 MG tablet Take 75 mg by mouth 2 (two) times daily.     No current facility-administered medications for this visit.     Medication Side Effects: none  Orders placed this visit:  No orders of the defined types were placed in this encounter.   Psychiatric Specialty Exam:  ROS  There were no vitals taken for this visit.There is no height or weight on file to calculate BMI.  General Appearance: Casual and Neat  Eye Contact:  Good  Speech:  Normal Rate  Volume:  Normal  Mood:  Euthymic  Affect:  Appropriate  Thought Process:  Coherent   Orientation:  Full (Time, Place, and Person)  Thought Content: Negative   Suicidal Thoughts:  No  Homicidal Thoughts:  No  Memory:  WNL  Judgement:  Fair  Insight:  Fair  Psychomotor Activity:  Normal  Concentration:  Concentration: Good  Recall:  NA  Fund of Knowledge: Good  Language: Good  Assets:  Communication Skills Desire for Improvement Financial Resources/Insurance Housing Intimacy Leisure Time Physical Health Resilience Social Support Energy managerTalents/Skills Transportation Vocational/Educational  ADL's:  Intact  Cognition: WNL  Prognosis:  Good   Screenings: None  Receiving Psychotherapy: No   Treatment Plan/Recommendations:   Plan:  1. Continue Seroquel 200mg  at hs 2. Continue Prozac 20mg  daily 3. Continue Lamictal 200mg  daily 4. Continue Xanax prn anxiety (dose unknown) - none needed this visit.  RTC 4 weeks  Consider therapy  Patient to discuss genetic testing with insurance company.  Patient advised to contact office with any questions, adverse effects, or acute worsening in signs and symptoms.  Discussed potential metabolic side effects associated with atypical antipsychotics, as well as potential risk for movement side effects. Advised pt to contact office if movement side effects occur.   Monitor for any sign of rash. Please taking Lamictal and contact office immediately rash develops. Recommend seeking urgent medical attention if rash is severe and/or spreading quickly.  Discussed potential benefits, risk, and side effects of benzodiazepines to include potential risk of tolerance and dependence, as well as possible drowsiness.  Advised patient not to drive if experiencing drowsiness and to take lowest possible effective dose to minimize risk of dependence and tolerance.      Dorothyann Gibbs, NP

## 2019-01-08 ENCOUNTER — Other Ambulatory Visit: Payer: Self-pay

## 2019-01-08 ENCOUNTER — Ambulatory Visit (INDEPENDENT_AMBULATORY_CARE_PROVIDER_SITE_OTHER): Payer: No Typology Code available for payment source | Admitting: Adult Health

## 2019-01-08 ENCOUNTER — Encounter: Payer: Self-pay | Admitting: Adult Health

## 2019-01-08 DIAGNOSIS — F3162 Bipolar disorder, current episode mixed, moderate: Secondary | ICD-10-CM

## 2019-01-08 DIAGNOSIS — G4700 Insomnia, unspecified: Secondary | ICD-10-CM

## 2019-01-08 DIAGNOSIS — F411 Generalized anxiety disorder: Secondary | ICD-10-CM | POA: Diagnosis not present

## 2019-01-08 DIAGNOSIS — F1210 Cannabis abuse, uncomplicated: Secondary | ICD-10-CM | POA: Diagnosis not present

## 2019-01-08 MED ORDER — QUETIAPINE FUMARATE 25 MG PO TABS: 25.0000 mg | ORAL_TABLET | Freq: Two times a day (BID) | ORAL | 2 refills | Status: DC

## 2019-01-08 MED FILL — QUETIAPINE 25 MG TABLET: 25 | 30 days supply | Qty: 60 | Fill #0

## 2019-01-08 MED FILL — LAMOTRIGINE 100 MG TABS: 100 | 30 days supply | Qty: 60 | Fill #0

## 2019-01-08 NOTE — Progress Notes (Signed)
[facility] MD/PA/NP Initial Note  01/08/2019 4:07 PM Ryan Hoffman  MRN:  [id number]  Chief Complaint:    HPI: Mr Ryan Hoffman is see today for Bipolar disorder, anxiety disorder and insomnia.  Describes mood today as "so-so". Irritable. Mood symptoms - reports depression, anxiety, and irritability. Stating "I'm stressed the hell out". Having "outbursts". One of the children broke something on the four wheeler. Had a gun out in the yard and was shooting at the ground. Started "tearing things up". The gun is now at a friend's house. Wife says he is an "addict". He has to have an "addiction" to something. Has been "talking to women online and lying about it". Wife feels like "he has to change or they have to split up". Feels like it is "too much" on her and everyone in the family. Stating "he is always on the edge". Doesn't talk to anybody - "so you don't know what he's thinking". Stating "I'm trying to do better". Willing to increase medications. Varying interest and motivation. Taking medications as prescribed.  Energy levels vary. Stating "he is good when he has something to do". Sitting around the house most of the day. Willing to increase current medications.  Enjoys some usual interests and activities. Spending time with family - wife and 3 children age 47, 3516, and 589. Getting out more - "staying busy".  Appetite adequate. Weight stable - 211.  Sleeps well most nights. Averages 8 hours. Not wearing his CPAP machine.  Focus and concentration stable. Completing tasks. Managing aspects of household. Denies SI or HI. Denies AH or VH.  Visit Diagnosis:    ICD-10-CM   1. Bipolar 1 disorder, mixed, moderate (HCC)  F31.62 QUEtiapine (SEROQUEL) 25 MG tablet  2. Insomnia, unspecified type  G47.00   3. Cannabis abuse  F12.10   4. GAD (generalized anxiety disorder)  F41.1     Past Psychiatric History:  One admission to Behavioral health unit. One stay at [facility] in [place].   Past Medical  History:  Past Medical History:  Diagnosis Date  . Allergy   . Anxiety   . Arthritis   . Bipolar 1 disorder (HCC)   . Chicken pox   . GERD (gastroesophageal reflux disease)     Past Surgical History:  Procedure Laterality Date  . ANTERIOR CRUCIATE LIGAMENT REPAIR    . ELBOW SURGERY    . KNEE SURGERY      Family Psychiatric History: Denies  Family History:  Family History  Problem Relation Age of Onset  . Hyperlipidemia Mother   . Hyperlipidemia Father   . Hypertension Father   . Arthritis Maternal Grandmother   . Heart disease Maternal Grandmother   . Arthritis Maternal Grandfather   . Heart disease Maternal Grandfather   . Arthritis Paternal Grandmother   . Heart disease Paternal Grandmother   . Arthritis Paternal Grandfather   . Heart disease Paternal Grandfather     Social History:  Social History   Socioeconomic History  . Marital status: Married    Spouse name: Not on file  . Number of children: 4  . Years of education: 4612  . Highest education level: Not on file  Occupational History  . Occupation: Engineer, sitelectrician  Social Needs  . Financial resource strain: Not on file  . Food insecurity    Worry: Not on file    Inability: Not on file  . Transportation needs    Medical: Not on file    Non-medical: Not on file  Tobacco  Use  . Smoking status: Former Smoker    Packs/day: 0.50    Types: Cigarettes    Quit date: 09/19/2018    Years since quitting: 0.3  . Smokeless tobacco: Current User    Types: Snuff  Substance and Sexual Activity  . Alcohol use: Yes    Alcohol/week: 0.0 standard drinks    Comment: occasionally, binge drinks  . Drug use: No  . Sexual activity: Not on file  Lifestyle  . Physical activity    Days per week: Not on file    Minutes per session: Not on file  . Stress: Not on file  Relationships  . Social Herbalist on phone: Not on file    Gets together: Not on file    Attends religious service: Not on file    Active member  of club or organization: Not on file    Attends meetings of clubs or organizations: Not on file    Relationship status: Not on file  Other Topics Concern  . Not on file  Social History Narrative   Denies religious beliefs effecting health care.     Allergies: No Known Allergies  Metabolic Disorder Labs: No results found for: HGBA1C, MPG No results found for: PROLACTIN Lab Results  Component Value Date   CHOL 163 06/20/2015   TRIG 76.0 06/20/2015   HDL 36.10 (L) 06/20/2015   CHOLHDL 5 06/20/2015   VLDL 15.2 06/20/2015   LDLCALC 112 (H) 06/20/2015   Lab Results  Component Value Date   TSH 1.35 06/20/2015    Therapeutic Level Labs: No results found for: LITHIUM Lab Results  Component Value Date   VALPROATE 24.9 (L) 07/02/2015   No components found for:  CBMZ  Current Medications: Current Outpatient Medications  Medication Sig Dispense Refill  . acetaminophen (TYLENOL) 500 MG tablet Take 1,000 mg by mouth every 6 (six) hours as needed.    . cyclobenzaprine (FLEXERIL) 10 MG tablet Take 1 tablet (10 mg total) by mouth 2 (two) times daily as needed for muscle spasms. (Patient not taking: Reported on 12/04/2018) 30 tablet 0  . FLUoxetine (PROZAC) 20 MG capsule Take 20 mg by mouth daily.    Marland Kitchen FLUoxetine (PROZAC) 20 MG capsule Take by mouth.    . lamoTRIgine (LAMICTAL) 100 MG tablet Take 1 tablet (100 mg total) by mouth 2 (two) times daily. 60 tablet 2  . lamoTRIgine (LAMICTAL) 100 MG tablet 1/2 - 1 tablet qhs    . loratadine (CLARITIN) 10 MG tablet Take 10 mg by mouth daily.    Marland Kitchen loratadine (CLARITIN) 10 MG tablet Take by mouth.    . Multiple Vitamin (MULTIVITAMIN) capsule Take by mouth.    . Multiple Vitamins-Minerals (MULTIVITAMIN WITH MINERALS) tablet Take 1 tablet by mouth daily.    . QUEtiapine (SEROQUEL) 200 MG tablet Take 200 mg by mouth at bedtime.    Marland Kitchen QUEtiapine (SEROQUEL) 200 MG tablet Take by mouth.    . QUEtiapine (SEROQUEL) 25 MG tablet Take 1 tablet (25 mg  total) by mouth 2 (two) times daily. 60 tablet 2  . ranitidine (ZANTAC) 75 MG tablet Take 75 mg by mouth 2 (two) times daily.     No current facility-administered medications for this visit.     Medication Side Effects: none  Orders placed this visit:  No orders of the defined types were placed in this encounter.   Psychiatric Specialty Exam:  ROS  There were no vitals taken for this visit.There is no  height or weight on file to calculate BMI.  General Appearance: Casual and Neat  Eye Contact:  Good  Speech:  Normal Rate  Volume:  Normal  Mood:  Irritable  Affect:  Appropriate  Thought Process:  Coherent  Orientation:  Full (Time, Place, and Person)  Thought Content: Negative   Suicidal Thoughts:  No  Homicidal Thoughts:  No  Memory:  WNL  Judgement:  Fair  Insight:  Fair  Psychomotor Activity:  Normal  Concentration:  Concentration: Good  Recall:  NA  Fund of Knowledge: Good  Language: Good  Assets:  Communication Skills Desire for Improvement Financial Resources/Insurance Housing Intimacy Leisure Time Physical Health Resilience Social Support Talents/Skills Transportation Vocational/Educational  ADL's:  Intact  Cognition: WNL  Prognosis:  Good   Screenings: None  Receiving Psychotherapy: No   Treatment Plan/Recommendations:   Plan:  1. Continue Seroquel 200mg  at hs 2. Continue Prozac 20mg  daily 3. Continue Lamictal 200mg  daily 4. Continue Xanax prn anxiety (dose unknown) - none needed this visit. 5. Add Seroquel 25mg  BID for mood instability.   Patient and wife arguing in office.  RTC 4 weeks  Consider therapy  Genetic testing discussed.   Patient advised to contact office with any questions, adverse effects, or acute worsening in signs and symptoms.  Discussed potential metabolic side effects associated with atypical antipsychotics, as well as potential risk for movement side effects. Advised pt to contact office if movement side effects  occur.   Monitor for any sign of rash. Please taking Lamictal and contact office immediately rash develops. Recommend seeking urgent medical attention if rash is severe and/or spreading quickly.  Discussed potential benefits, risk, and side effects of benzodiazepines to include potential risk of tolerance and dependence, as well as possible drowsiness.  Advised patient not to drive if experiencing drowsiness and to take lowest possible effective dose to minimize risk of dependence and tolerance.    Dorothyann Gibbs, NP

## 2019-01-23 ENCOUNTER — Ambulatory Visit: Payer: No Typology Code available for payment source | Admitting: Adult Health

## 2019-02-05 ENCOUNTER — Ambulatory Visit: Payer: No Typology Code available for payment source | Admitting: Adult Health

## 2019-02-08 MED FILL — QUETIAPINE FUMARATE 200 MG: 200 | 90 days supply | Qty: 90 | Fill #1

## 2019-02-08 MED FILL — QUETIAPINE 25 MG TABLET: 25 | 30 days supply | Qty: 60 | Fill #1

## 2019-02-08 MED FILL — FLUoxetine HCL 20 MG CAPS: 20 | 90 days supply | Qty: 90 | Fill #1

## 2019-02-08 MED FILL — LAMOTRIGINE 100 MG TABS: 100 | 30 days supply | Qty: 60 | Fill #1

## 2019-02-13 ENCOUNTER — Other Ambulatory Visit: Payer: Self-pay

## 2019-02-13 ENCOUNTER — Encounter: Payer: Self-pay | Admitting: Adult Health

## 2019-02-13 ENCOUNTER — Ambulatory Visit (INDEPENDENT_AMBULATORY_CARE_PROVIDER_SITE_OTHER): Payer: No Typology Code available for payment source | Admitting: Adult Health

## 2019-02-13 DIAGNOSIS — G4700 Insomnia, unspecified: Secondary | ICD-10-CM | POA: Diagnosis not present

## 2019-02-13 DIAGNOSIS — F1210 Cannabis abuse, uncomplicated: Secondary | ICD-10-CM | POA: Diagnosis not present

## 2019-02-13 DIAGNOSIS — F411 Generalized anxiety disorder: Secondary | ICD-10-CM

## 2019-02-13 DIAGNOSIS — F3162 Bipolar disorder, current episode mixed, moderate: Secondary | ICD-10-CM | POA: Diagnosis not present

## 2019-02-13 MED ORDER — FLUOXETINE HCL 20 MG PO CAPS: 20.0000 mg | ORAL_CAPSULE | Freq: Every day | ORAL | 2 refills | Status: DC

## 2019-02-13 MED ORDER — QUETIAPINE FUMARATE 200 MG PO TABS: 200.0000 mg | ORAL_TABLET | Freq: Every day | ORAL | 2 refills | Status: DC

## 2019-02-13 MED ORDER — LAMOTRIGINE 100 MG PO TABS: 100.0000 mg | ORAL_TABLET | Freq: Two times a day (BID) | ORAL | 2 refills | Status: DC

## 2019-02-13 NOTE — Progress Notes (Signed)
[facility] MD/PA/NP Medication Check  02/13/2019 10:04 AM Senaida LangeSteven C Abrell  MRN:  [id number]  Chief Complaint:    HPI: [name] is see today for Bipolar disorder, anxiety disorder and insomnia.  Describes mood today as "ok". Pleasant. Mood symptoms - reports decreased depression, anxiety, and irritability. Stating "I feel like I'm doing better". Reports getting "mad" last week and throwing all of his medications in the trash can. Wife has since gotten them refilled and he is taking medications as prescribed. Reports one "ouburst" since medication changes. He and wife not getting along well "lately". Wife found out he was "online" dating. Improved  interest and motivation. Taking medications as prescribed.  Energy levels stable. Active, does not have a regular exercise routine. Unable to work with current "anger" issues.  Enjoys some usual interests and activities. Spending time with family - wife and 3 children age 47, 6016, and 559.  Appetite adequate. Weight stable. Sleeps well most nights. Averages 8 hours. Not wearing his CPAP machine.  Focus and concentration stable. Completing tasks. Managing aspects of household. Denies SI or HI. Denies AH or VH.  Session ended with patient "yelling and cussing" at wife and walking out.   Visit Diagnosis:    ICD-10-CM   1. GAD (generalized anxiety disorder)  F41.1   2. Cannabis abuse  F12.10   3. Insomnia, unspecified type  G47.00   4. Bipolar 1 disorder, mixed, moderate (HCC)  F31.62     Past Psychiatric History:  One admission to Behavioral health unit. One stay at [facility] in [place].   Past Medical History:  Past Medical History:  Diagnosis Date  . Allergy   . Anxiety   . Arthritis   . Bipolar 1 disorder (HCC)   . Chicken pox   . GERD (gastroesophageal reflux disease)     Past Surgical History:  Procedure Laterality Date  . ANTERIOR CRUCIATE LIGAMENT REPAIR    . ELBOW SURGERY    . KNEE SURGERY      Family Psychiatric  History: Denies  Family History:  Family History  Problem Relation Age of Onset  . Hyperlipidemia Mother   . Hyperlipidemia Father   . Hypertension Father   . Arthritis Maternal Grandmother   . Heart disease Maternal Grandmother   . Arthritis Maternal Grandfather   . Heart disease Maternal Grandfather   . Arthritis Paternal Grandmother   . Heart disease Paternal Grandmother   . Arthritis Paternal Grandfather   . Heart disease Paternal Grandfather     Social History:  Social History   Socioeconomic History  . Marital status: Married    Spouse name: Not on file  . Number of children: 4  . Years of education: 4712  . Highest education level: Not on file  Occupational History  . Occupation: Engineer, sitelectrician  Social Needs  . Financial resource strain: Not on file  . Food insecurity    Worry: Not on file    Inability: Not on file  . Transportation needs    Medical: Not on file    Non-medical: Not on file  Tobacco Use  . Smoking status: Former Smoker    Packs/day: 0.50    Types: Cigarettes    Quit date: 09/19/2018    Years since quitting: 0.4  . Smokeless tobacco: Current User    Types: Snuff  Substance and Sexual Activity  . Alcohol use: Yes    Alcohol/week: 0.0 standard drinks    Comment: occasionally, binge drinks  . Drug use: No  .  Sexual activity: Not on file  Lifestyle  . Physical activity    Days per week: Not on file    Minutes per session: Not on file  . Stress: Not on file  Relationships  . Social Herbalist on phone: Not on file    Gets together: Not on file    Attends religious service: Not on file    Active member of club or organization: Not on file    Attends meetings of clubs or organizations: Not on file    Relationship status: Not on file  Other Topics Concern  . Not on file  Social History Narrative   Denies religious beliefs effecting health care.     Allergies: No Known Allergies  Metabolic Disorder Labs: No results found for:  HGBA1C, MPG No results found for: PROLACTIN Lab Results  Component Value Date   CHOL 163 06/20/2015   TRIG 76.0 06/20/2015   HDL 36.10 (L) 06/20/2015   CHOLHDL 5 06/20/2015   VLDL 15.2 06/20/2015   LDLCALC 112 (H) 06/20/2015   Lab Results  Component Value Date   TSH 1.35 06/20/2015    Therapeutic Level Labs: No results found for: LITHIUM Lab Results  Component Value Date   VALPROATE 24.9 (L) 07/02/2015   No components found for:  CBMZ  Current Medications: Current Outpatient Medications  Medication Sig Dispense Refill  . acetaminophen (TYLENOL) 500 MG tablet Take 1,000 mg by mouth every 6 (six) hours as needed.    . cyclobenzaprine (FLEXERIL) 10 MG tablet Take 1 tablet (10 mg total) by mouth 2 (two) times daily as needed for muscle spasms. (Patient not taking: Reported on 12/04/2018) 30 tablet 0  . FLUoxetine (PROZAC) 20 MG capsule Take 20 mg by mouth daily.    Marland Kitchen FLUoxetine (PROZAC) 20 MG capsule Take by mouth.    . lamoTRIgine (LAMICTAL) 100 MG tablet Take 1 tablet (100 mg total) by mouth 2 (two) times daily. 60 tablet 2  . lamoTRIgine (LAMICTAL) 100 MG tablet 1/2 - 1 tablet qhs    . loratadine (CLARITIN) 10 MG tablet Take 10 mg by mouth daily.    Marland Kitchen loratadine (CLARITIN) 10 MG tablet Take by mouth.    . Multiple Vitamin (MULTIVITAMIN) capsule Take by mouth.    . Multiple Vitamins-Minerals (MULTIVITAMIN WITH MINERALS) tablet Take 1 tablet by mouth daily.    . QUEtiapine (SEROQUEL) 200 MG tablet Take 200 mg by mouth at bedtime.    Marland Kitchen QUEtiapine (SEROQUEL) 200 MG tablet Take by mouth.    . QUEtiapine (SEROQUEL) 25 MG tablet Take 1 tablet (25 mg total) by mouth 2 (two) times daily. 60 tablet 2  . ranitidine (ZANTAC) 75 MG tablet Take 75 mg by mouth 2 (two) times daily.     No current facility-administered medications for this visit.     Medication Side Effects: none  Orders placed this visit:  No orders of the defined types were placed in this encounter.   Psychiatric  Specialty Exam:  ROS  There were no vitals taken for this visit.There is no height or weight on file to calculate BMI.  General Appearance: Casual and Neat  Eye Contact:  Good  Speech:  Normal Rate  Volume:  Normal  Mood:  Irritable  Affect:  Appropriate  Thought Process:  Coherent  Orientation:  Full (Time, Place, and Person)  Thought Content: Negative   Suicidal Thoughts:  No  Homicidal Thoughts:  No  Memory:  WNL  Judgement:  Fair  Insight:  Fair  Psychomotor Activity:  Normal  Concentration:  Concentration: Good  Recall:  NA  Fund of Knowledge: Good  Language: Good  Assets:  Communication Skills Desire for Improvement Financial Resources/Insurance Housing Intimacy Leisure Time Physical Health Resilience Social Support Talents/Skills Transportation Vocational/Educational  ADL's:  Intact  Cognition: WNL  Prognosis:  Good   Screenings: None  Receiving Psychotherapy: No   Treatment Plan/Recommendations:   Plan:  1. Continue Seroquel 200mg  at hs 2. Continue Prozac 20mg  daily 3. Continue Lamictal 200mg  daily 4. Continue Xanax prn anxiety (dose unknown) - none needed this visit. 5. Add Seroquel 25mg  BID for mood instability.   RTC 4 weeks  Consider therapy  Genetic testing discussed.   Patient advised to contact office with any questions, adverse effects, or acute worsening in signs and symptoms.  Discussed potential metabolic side effects associated with atypical antipsychotics, as well as potential risk for movement side effects. Advised pt to contact office if movement side effects occur.   Monitor for any sign of rash. Please taking Lamictal and contact office immediately rash develops. Recommend seeking urgent medical attention if rash is severe and/or spreading quickly.  Discussed potential benefits, risk, and side effects of benzodiazepines to include potential risk of tolerance and dependence, as well as possible drowsiness.  Advised patient not  to drive if experiencing drowsiness and to take lowest possible effective dose to minimize risk of dependence and tolerance.    , NP

## 2019-03-13 ENCOUNTER — Encounter: Payer: Self-pay | Admitting: Adult Health

## 2019-03-13 ENCOUNTER — Ambulatory Visit (INDEPENDENT_AMBULATORY_CARE_PROVIDER_SITE_OTHER): Payer: No Typology Code available for payment source | Admitting: Adult Health

## 2019-03-13 DIAGNOSIS — F411 Generalized anxiety disorder: Secondary | ICD-10-CM

## 2019-03-13 DIAGNOSIS — F3162 Bipolar disorder, current episode mixed, moderate: Secondary | ICD-10-CM

## 2019-03-13 DIAGNOSIS — F1210 Cannabis abuse, uncomplicated: Secondary | ICD-10-CM | POA: Diagnosis not present

## 2019-03-13 DIAGNOSIS — G4700 Insomnia, unspecified: Secondary | ICD-10-CM

## 2019-03-13 MED ORDER — QUETIAPINE FUMARATE 50 MG PO TABS: 50.0000 mg | ORAL_TABLET | Freq: Two times a day (BID) | ORAL | 2 refills | Status: DC

## 2019-03-13 MED ORDER — FLUOXETINE HCL 20 MG PO CAPS: 20.0000 mg | ORAL_CAPSULE | Freq: Every day | ORAL | 2 refills | Status: DC

## 2019-03-13 MED ORDER — ALPRAZOLAM 0.5 MG PO TABS: 0.5000 mg | ORAL_TABLET | Freq: Every day | ORAL | 0 refills | Status: DC | PRN

## 2019-03-13 MED ORDER — QUETIAPINE FUMARATE 200 MG PO TABS: 200.0000 mg | ORAL_TABLET | Freq: Every day | ORAL | 2 refills | Status: DC

## 2019-03-13 MED ORDER — LAMOTRIGINE 100 MG PO TABS: 100.0000 mg | ORAL_TABLET | Freq: Two times a day (BID) | ORAL | 2 refills | Status: DC

## 2019-03-13 MED FILL — QUETIAPINE FUMARATE 50 MG T: 50 | 30 days supply | Qty: 60 | Fill #0

## 2019-03-13 MED FILL — ALPRAZolam 0.5 MG TABS: 0.5 | 15 days supply | Qty: 15 | Fill #0

## 2019-03-13 MED FILL — LAMOTRIGINE 100 MG TABS: 100 | 30 days supply | Qty: 60 | Fill #0

## 2019-03-13 NOTE — Progress Notes (Signed)
[facility] MD/PA/NP Medication Check  03/13/2019 1:25 PM Ryan LangeSteven C Hoffman  MRN:  [id number]  Chief Complaint:    HPI: Mr Ryan Hoffman is see today for Bipolar disorder, anxiety disorder and insomnia.  Describes mood today as "not the best". Pleasant. Mood symptoms - reports depression, anxiety, and irritability. Stating "things aren't the best right now". Having more and more "outbursts". Doesn't feel like medications are working well. Stating "the better I feel, the more outbursts I have". Stating "it seems like going to the gym would be helpful, but it's not". Thinks he may have Corona virus currently - "sick for 2 days". Has not been for testing - "I can't go with all this stomach upset". He and wife not doing well - selling their house and "going our separate ways". Imroved interest and motivation. Taking medications as prescribed.  Energy levels stable. Active, has a regular exercise routine - every day - [employer].  Enjoys some usual interests and activities. Married. Lives with wife and 3 children - 6118, 7116, and 9.  Appetite adequate. Weight loss - 30 pounds. Sleeps well most nights - "eating better". Averages 8 hours. Not wearing his CPAP machine.  Focus and concentration stable. Completing tasks. Managing aspects of household. Denies SI or HI. Denies AH or VH.  Visit Diagnosis:    ICD-10-CM   1. GAD (generalized anxiety disorder)  F41.1 FLUoxetine (PROZAC) 20 MG capsule    ALPRAZolam (XANAX) 0.5 MG tablet  2. Cannabis abuse  F12.10   3. Insomnia, unspecified type  G47.00   4. Bipolar 1 disorder, mixed, moderate (HCC)  F31.62 QUEtiapine (SEROQUEL) 50 MG tablet    QUEtiapine (SEROQUEL) 200 MG tablet    lamoTRIgine (LAMICTAL) 100 MG tablet    Past Psychiatric History:  One admission to Behavioral health unit. One stay at [facility] in [place].   Past Medical History:  Past Medical History:  Diagnosis Date  . Allergy   . Anxiety   . Arthritis   . Bipolar 1 disorder  (HCC)   . Chicken pox   . GERD (gastroesophageal reflux disease)     Past Surgical History:  Procedure Laterality Date  . ANTERIOR CRUCIATE LIGAMENT REPAIR    . ELBOW SURGERY    . KNEE SURGERY      Family Psychiatric History: Denies  Family History:  Family History  Problem Relation Age of Onset  . Hyperlipidemia Mother   . Hyperlipidemia Father   . Hypertension Father   . Arthritis Maternal Grandmother   . Heart disease Maternal Grandmother   . Arthritis Maternal Grandfather   . Heart disease Maternal Grandfather   . Arthritis Paternal Grandmother   . Heart disease Paternal Grandmother   . Arthritis Paternal Grandfather   . Heart disease Paternal Grandfather     Social History:  Social History   Socioeconomic History  . Marital status: Married    Spouse name: Not on file  . Number of children: 4  . Years of education: 3612  . Highest education level: Not on file  Occupational History  . Occupation: Engineer, sitelectrician  Social Needs  . Financial resource strain: Not on file  . Food insecurity    Worry: Not on file    Inability: Not on file  . Transportation needs    Medical: Not on file    Non-medical: Not on file  Tobacco Use  . Smoking status: Former Smoker    Packs/day: 0.50    Types: Cigarettes    Quit date: 09/19/2018  Years since quitting: 0.4  . Smokeless tobacco: Current User    Types: Snuff  Substance and Sexual Activity  . Alcohol use: Yes    Alcohol/week: 0.0 standard drinks    Comment: occasionally, binge drinks  . Drug use: No  . Sexual activity: Not on file  Lifestyle  . Physical activity    Days per week: Not on file    Minutes per session: Not on file  . Stress: Not on file  Relationships  . Social Herbalist on phone: Not on file    Gets together: Not on file    Attends religious service: Not on file    Active member of club or organization: Not on file    Attends meetings of clubs or organizations: Not on file    Relationship  status: Not on file  Other Topics Concern  . Not on file  Social History Narrative   Denies religious beliefs effecting health care.     Allergies: No Known Allergies  Metabolic Disorder Labs: No results found for: HGBA1C, MPG No results found for: PROLACTIN Lab Results  Component Value Date   CHOL 163 06/20/2015   TRIG 76.0 06/20/2015   HDL 36.10 (L) 06/20/2015   CHOLHDL 5 06/20/2015   VLDL 15.2 06/20/2015   LDLCALC 112 (H) 06/20/2015   Lab Results  Component Value Date   TSH 1.35 06/20/2015    Therapeutic Level Labs: No results found for: LITHIUM Lab Results  Component Value Date   VALPROATE 24.9 (L) 07/02/2015   No components found for:  CBMZ  Current Medications: Current Outpatient Medications  Medication Sig Dispense Refill  . acetaminophen (TYLENOL) 500 MG tablet Take 1,000 mg by mouth every 6 (six) hours as needed.    . ALPRAZolam (XANAX) 0.5 MG tablet Take 1 tablet (0.5 mg total) by mouth daily as needed for anxiety. 15 tablet 0  . cyclobenzaprine (FLEXERIL) 10 MG tablet Take 1 tablet (10 mg total) by mouth 2 (two) times daily as needed for muscle spasms. (Patient not taking: Reported on 12/04/2018) 30 tablet 0  . FLUoxetine (PROZAC) 20 MG capsule Take by mouth.    Marland Kitchen FLUoxetine (PROZAC) 20 MG capsule Take 1 capsule (20 mg total) by mouth daily. 30 capsule 2  . lamoTRIgine (LAMICTAL) 100 MG tablet 1/2 - 1 tablet qhs    . lamoTRIgine (LAMICTAL) 100 MG tablet Take 1 tablet (100 mg total) by mouth 2 (two) times daily. 60 tablet 2  . loratadine (CLARITIN) 10 MG tablet Take 10 mg by mouth daily.    Marland Kitchen loratadine (CLARITIN) 10 MG tablet Take by mouth.    . Multiple Vitamin (MULTIVITAMIN) capsule Take by mouth.    . Multiple Vitamins-Minerals (MULTIVITAMIN WITH MINERALS) tablet Take 1 tablet by mouth daily.    . QUEtiapine (SEROQUEL) 200 MG tablet Take by mouth.    . QUEtiapine (SEROQUEL) 200 MG tablet Take 1 tablet (200 mg total) by mouth at bedtime. 30 tablet 2  .  QUEtiapine (SEROQUEL) 50 MG tablet Take 1 tablet (50 mg total) by mouth 2 (two) times daily. 60 tablet 2  . ranitidine (ZANTAC) 75 MG tablet Take 75 mg by mouth 2 (two) times daily.     No current facility-administered medications for this visit.     Medication Side Effects: none  Orders placed this visit:  No orders of the defined types were placed in this encounter.   Psychiatric Specialty Exam:  Review of Systems  Musculoskeletal: Negative for  falls.  Neurological: Negative for tremors, seizures and weakness.  Psychiatric/Behavioral: Negative for suicidal ideas. The patient is nervous/anxious and has insomnia.     There were no vitals taken for this visit.There is no height or weight on file to calculate BMI.  General Appearance: Casual and Neat  Eye Contact:  Good  Speech:  Normal Rate  Volume:  Normal  Mood:  Irritable  Affect:  Appropriate  Thought Process:  Coherent and Descriptions of Associations: Intact  Orientation:  Full (Time, Place, and Person)  Thought Content: Negative   Suicidal Thoughts:  No  Homicidal Thoughts:  No  Memory:  WNL  Judgement:  Fair  Insight:  Fair  Psychomotor Activity:  Normal  Concentration:  Concentration: Good  Recall:  NA  Fund of Knowledge: Good  Language: Good  Assets:  Communication Skills Desire for Improvement Financial Resources/Insurance Housing Intimacy Leisure Time Physical Health Resilience Social Support Talents/Skills Transportation Vocational/Educational  ADL's:  Intact  Cognition: WNL  Prognosis:  Good   Screenings: None  Receiving Psychotherapy: No   Treatment Plan/Recommendations:   Plan:  1. Continue Seroquel 200mg  at hs 2. Continue Prozac 20mg  daily 3. Continue Lamictal 200mg  daily 4. Continue Xanax prn anxiety (dose unknown) - none needed this visit. 5. Increase Seroquel 25mg  BID to 50mg  BID for mood instability.   RTC 4 weeks  Consider therapy  Genetic testing discussed.   Patient  advised to contact office with any questions, adverse effects, or acute worsening in signs and symptoms.  Discussed potential metabolic side effects associated with atypical antipsychotics, as well as potential risk for movement side effects. Advised pt to contact office if movement side effects occur.   Monitor for any sign of rash. Please taking Lamictal and contact office immediately rash develops. Recommend seeking urgent medical attention if rash is severe and/or spreading quickly.  Discussed potential benefits, risk, and side effects of benzodiazepines to include potential risk of tolerance and dependence, as well as possible drowsiness.  Advised patient not to drive if experiencing drowsiness and to take lowest possible effective dose to minimize risk of dependence and tolerance.    , NP

## 2019-04-03 ENCOUNTER — Other Ambulatory Visit: Payer: Self-pay

## 2019-04-03 ENCOUNTER — Encounter: Payer: Self-pay | Admitting: Adult Health

## 2019-04-03 ENCOUNTER — Ambulatory Visit (INDEPENDENT_AMBULATORY_CARE_PROVIDER_SITE_OTHER): Payer: No Typology Code available for payment source | Admitting: Adult Health

## 2019-04-03 DIAGNOSIS — F3162 Bipolar disorder, current episode mixed, moderate: Secondary | ICD-10-CM

## 2019-04-03 DIAGNOSIS — F1210 Cannabis abuse, uncomplicated: Secondary | ICD-10-CM

## 2019-04-03 DIAGNOSIS — F411 Generalized anxiety disorder: Secondary | ICD-10-CM | POA: Diagnosis not present

## 2019-04-03 DIAGNOSIS — G4700 Insomnia, unspecified: Secondary | ICD-10-CM

## 2019-04-03 NOTE — Progress Notes (Signed)
Ryan LangeSteven C Hoffman [id number] November 08, 1970 48 y.o.  Subjective:   Patient ID:  Ryan LangeSteven C Hoffman is a 48 y.o. (DOB November 08, 1970) male.  Chief Complaint: No chief complaint on file.   HPI Ryan Hoffman presents to the office today for follow-up of GAD, insomnia, BPD1, THC abuse  HPI: Ryan Hoffman is see today for Bipolar disorder, anxiety disorder, THC, and insomnia.  Describes mood today as "ok". Pleasant. Mood symptoms - reports some depression, anxiety, and irritability. Stating "some days are better than others". He and wife separated. Stating "we are better off as friends". Continues to have "outbursts". Has had more people in the house lately - "that pisses me off". Not into selling the house 100%. Stating "her family is all up in there and it's my house". Stable interest and motivation. Taking medications as prescribed.  Energy levels stable. Active, has a regular exercise routine - stating "I can work through shit there". Unemployed. Enjoys some usual interests and activities. Married. Lives with wife and 3 children - 8518, 6416, and 10. Family in [place].  Appetite adequate. Weight loss - 207 pounds. Sleeps well most nights. Averages 5 hours. Some daytime napping. Not wearing his CPAP machine.  Focus and concentration stable. Completing tasks. Managing aspects of household - "not really". Denies SI or HI. Denies AH or VH.   Review of Systems:  Review of Systems  Musculoskeletal: Negative for gait problem.  Neurological: Negative for tremors.  Psychiatric/Behavioral:       Please refer to HPI    Medications: I have reviewed the patient's current medications.  Current Outpatient Medications  Medication Sig Dispense Refill  . acetaminophen (TYLENOL) 500 MG tablet Take 1,000 mg by mouth every 6 (six) hours as needed.    . ALPRAZolam (XANAX) 0.5 MG tablet Take 1 tablet (0.5 mg total) by mouth daily as needed for anxiety. 15 tablet 0  . cyclobenzaprine (FLEXERIL) 10 MG tablet Take 1  tablet (10 mg total) by mouth 2 (two) times daily as needed for muscle spasms. (Patient not taking: Reported on 12/04/2018) 30 tablet 0  . FLUoxetine (PROZAC) 20 MG capsule Take by mouth.    Marland Kitchen. FLUoxetine (PROZAC) 20 MG capsule Take 1 capsule (20 mg total) by mouth daily. 30 capsule 2  . lamoTRIgine (LAMICTAL) 100 MG tablet 1/2 - 1 tablet qhs    . lamoTRIgine (LAMICTAL) 100 MG tablet Take 1 tablet (100 mg total) by mouth 2 (two) times daily. 60 tablet 2  . loratadine (CLARITIN) 10 MG tablet Take 10 mg by mouth daily.    Marland Kitchen. loratadine (CLARITIN) 10 MG tablet Take by mouth.    . Multiple Vitamin (MULTIVITAMIN) capsule Take by mouth.    . Multiple Vitamins-Minerals (MULTIVITAMIN WITH MINERALS) tablet Take 1 tablet by mouth daily.    . QUEtiapine (SEROQUEL) 200 MG tablet Take by mouth.    . QUEtiapine (SEROQUEL) 200 MG tablet Take 1 tablet (200 mg total) by mouth at bedtime. 30 tablet 2  . QUEtiapine (SEROQUEL) 50 MG tablet Take 1 tablet (50 mg total) by mouth 2 (two) times daily. 60 tablet 2  . ranitidine (ZANTAC) 75 MG tablet Take 75 mg by mouth 2 (two) times daily.     No current facility-administered medications for this visit.    Medication Side Effects: None  Allergies: No Known Allergies  Past Medical History:  Diagnosis Date  . Allergy   . Anxiety   . Arthritis   . Bipolar 1 disorder (HCC)   . Chicken  pox   . GERD (gastroesophageal reflux disease)     Family History  Problem Relation Age of Onset  . Hyperlipidemia Mother   . Hyperlipidemia Father   . Hypertension Father   . Arthritis Maternal Grandmother   . Heart disease Maternal Grandmother   . Arthritis Maternal Grandfather   . Heart disease Maternal Grandfather   . Arthritis Paternal Grandmother   . Heart disease Paternal Grandmother   . Arthritis Paternal Grandfather   . Heart disease Paternal Grandfather     Social History   Socioeconomic History  . Marital status: Married    Spouse name: Not on file  .  Number of children: 4  . Years of education: 64  . Highest education level: Not on file  Occupational History  . Occupation: Personnel officer  Tobacco Use  . Smoking status: Former Smoker    Packs/day: 0.50    Types: Cigarettes    Quit date: 09/19/2018    Years since quitting: 0.5  . Smokeless tobacco: Current User    Types: Snuff  Substance and Sexual Activity  . Alcohol use: Yes    Alcohol/week: 0.0 standard drinks    Comment: occasionally, binge drinks  . Drug use: No  . Sexual activity: Not on file  Other Topics Concern  . Not on file  Social History Narrative   Denies religious beliefs effecting health care.    Social Determinants of Health   Financial Resource Strain:   . Difficulty of Paying Living Expenses: Not on file  Food Insecurity:   . Worried About Programme researcher, broadcasting/film/video in the Last Year: Not on file  . Ran Out of Food in the Last Year: Not on file  Transportation Needs:   . Lack of Transportation (Medical): Not on file  . Lack of Transportation (Non-Medical): Not on file  Physical Activity:   . Days of Exercise per Week: Not on file  . Minutes of Exercise per Session: Not on file  Stress:   . Feeling of Stress : Not on file  Social Connections:   . Frequency of Communication with Friends and Family: Not on file  . Frequency of Social Gatherings with Friends and Family: Not on file  . Attends Religious Services: Not on file  . Active Member of Clubs or Organizations: Not on file  . Attends Banker Meetings: Not on file  . Marital Status: Not on file  Intimate Partner Violence:   . Fear of Current or Ex-Partner: Not on file  . Emotionally Abused: Not on file  . Physically Abused: Not on file  . Sexually Abused: Not on file    Past Medical History, Surgical history, Social history, and Family history were reviewed and updated as appropriate.   Please see review of systems for further details on the patient's review from today.   Objective:    Physical Exam:  There were no vitals taken for this visit.  Physical Exam  Lab Review:     Component Value Date/Time   NA 136 09/13/2018 0912   K 4.0 09/13/2018 0912   CL 99 09/13/2018 0912   CO2 25 09/13/2018 0912   GLUCOSE 111 (H) 09/13/2018 0912   BUN 9 09/13/2018 0912   CREATININE 0.87 09/13/2018 0912   CALCIUM 9.0 09/13/2018 0912   PROT 7.7 09/13/2018 0912   ALBUMIN 4.5 09/13/2018 0912   AST 43 (H) 09/13/2018 0912   ALT 69 (H) 09/13/2018 0912   ALKPHOS 96 09/13/2018 0912   BILITOT  0.6 09/13/2018 0912   GFRNONAA >60 09/13/2018 0912   GFRAA >60 09/13/2018 0912       Component Value Date/Time   WBC 16.0 (H) 09/13/2018 0912   RBC 5.42 09/13/2018 0912   HGB 15.5 09/13/2018 0912   HCT 48.8 09/13/2018 0912   PLT 402 (H) 09/13/2018 0912   MCV 90.0 09/13/2018 0912   MCH 28.6 09/13/2018 0912   MCHC 31.8 09/13/2018 0912   RDW 13.1 09/13/2018 0912   LYMPHSABS 2.2 09/13/2018 0912   MONOABS 0.8 09/13/2018 0912   EOSABS 0.1 09/13/2018 0912   BASOSABS 0.1 09/13/2018 0912    No results found for: POCLITH, LITHIUM   Lab Results  Component Value Date   VALPROATE 24.9 (L) 07/02/2015     .res Assessment: Plan:    Plan:  1. Continue Seroquel 200mg  at hs 2. Continue Prozac 20mg  daily 3. Continue Lamictal 200mg  daily 4. Continue Xanax prn anxiety (dose unknown) - none needed this visit. 5. Continue Seroquel 50mg  BID for mood instability.   RTC 4 weeks  Consider therapy  Genetic testing discussed.   Patient advised to contact office with any questions, adverse effects, or acute worsening in signs and symptoms.  Discussed potential metabolic side effects associated with atypical antipsychotics, as well as potential risk for movement side effects. Advised pt to contact office if movement side effects occur.   Monitor for any sign of rash. Please taking Lamictal and contact office immediately rash develops. Recommend seeking urgent medical attention if rash is  severe and/or spreading quickly.  Discussed potential benefits, risk, and side effects of benzodiazepines to include potential risk of tolerance and dependence, as well as possible drowsiness.  Advised patient not to drive if experiencing drowsiness and to take lowest possible effective dose to minimize risk of dependence and tolerance.  Diagnoses and all orders for this visit:  GAD (generalized anxiety disorder)  Cannabis abuse  Bipolar 1 disorder, mixed, moderate (HCC)  Insomnia, unspecified type     Please see After Visit Summary for patient specific instructions.  No future appointments.  No orders of the defined types were placed in this encounter.   -------------------------------

## 2019-04-15 MED FILL — LAMOTRIGINE 100 MG TABS: 100 | 30 days supply | Qty: 60 | Fill #1

## 2019-04-16 MED FILL — QUETIAPINE FUMARATE 50 MG T: 50 | 30 days supply | Qty: 60 | Fill #1

## 2019-04-24 ENCOUNTER — Other Ambulatory Visit: Payer: Self-pay

## 2019-04-24 ENCOUNTER — Ambulatory Visit (INDEPENDENT_AMBULATORY_CARE_PROVIDER_SITE_OTHER): Payer: No Typology Code available for payment source | Admitting: Adult Health

## 2019-04-24 ENCOUNTER — Encounter: Payer: Self-pay | Admitting: Adult Health

## 2019-04-24 DIAGNOSIS — F411 Generalized anxiety disorder: Secondary | ICD-10-CM | POA: Diagnosis not present

## 2019-04-24 DIAGNOSIS — G4700 Insomnia, unspecified: Secondary | ICD-10-CM

## 2019-04-24 DIAGNOSIS — F1210 Cannabis abuse, uncomplicated: Secondary | ICD-10-CM | POA: Diagnosis not present

## 2019-04-24 DIAGNOSIS — F3162 Bipolar disorder, current episode mixed, moderate: Secondary | ICD-10-CM
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
## 2019-05-01 ENCOUNTER — Ambulatory Visit: Payer: No Typology Code available for payment source | Admitting: Adult Health

## 2019-05-02 MED FILL — QUETIAPINE FUMARATE 200 MG: 200 | 30 days supply | Qty: 30 | Fill #0

## 2019-05-09 MED FILL — LAMOTRIGINE 100 MG TABS: 100 | 30 days supply | Qty: 60 | Fill #2

## 2019-05-09 MED FILL — FLUoxetine HCL 20 MG CAPS: 20 | 30 days supply | Qty: 30 | Fill #0

## 2019-05-22 ENCOUNTER — Ambulatory Visit: Payer: No Typology Code available for payment source | Admitting: Adult Health

## 2019-05-28 MED FILL — QUETIAPINE FUMARATE 50 MG T: 50 | 30 days supply | Qty: 60 | Fill #2

## 2019-05-28 MED FILL — QUETIAPINE FUMARATE 200 MG: 200 | 30 days supply | Qty: 30 | Fill #1

## 2019-06-07 ENCOUNTER — Ambulatory Visit: Payer: No Typology Code available for payment source | Admitting: Adult Health

## 2019-06-14 ENCOUNTER — Ambulatory Visit: Payer: No Typology Code available for payment source | Admitting: Adult Health

## 2019-06-18 ENCOUNTER — Ambulatory Visit (INDEPENDENT_AMBULATORY_CARE_PROVIDER_SITE_OTHER): Payer: No Typology Code available for payment source | Admitting: Adult Health

## 2019-06-18 ENCOUNTER — Other Ambulatory Visit: Payer: Self-pay

## 2019-06-18 ENCOUNTER — Encounter: Payer: Self-pay | Admitting: Adult Health

## 2019-06-18 DIAGNOSIS — F3162 Bipolar disorder, current episode mixed, moderate: Secondary | ICD-10-CM

## 2019-06-18 DIAGNOSIS — F411 Generalized anxiety disorder: Secondary | ICD-10-CM

## 2019-06-18 DIAGNOSIS — G4700 Insomnia, unspecified: Secondary | ICD-10-CM

## 2019-06-18 DIAGNOSIS — F1210 Cannabis abuse, uncomplicated: Secondary | ICD-10-CM

## 2019-06-18 MED ORDER — ALPRAZOLAM 0.5 MG PO TABS: 0.5000 mg | ORAL_TABLET | Freq: Every day | ORAL | 2 refills | Status: DC | PRN

## 2019-06-18 MED ORDER — LAMOTRIGINE 100 MG PO TABS: 100.0000 mg | ORAL_TABLET | Freq: Two times a day (BID) | ORAL | 1 refills | Status: AC

## 2019-06-18 MED ORDER — QUETIAPINE FUMARATE 300 MG PO TABS: 300.0000 mg | ORAL_TABLET | Freq: Every day | ORAL | 1 refills | Status: DC

## 2019-06-18 MED ORDER — FLUOXETINE HCL 20 MG PO CAPS: 20.0000 mg | ORAL_CAPSULE | Freq: Every day | ORAL | 1 refills | Status: AC

## 2019-06-18 MED ORDER — QUETIAPINE FUMARATE 50 MG PO TABS: 50.0000 mg | ORAL_TABLET | Freq: Two times a day (BID) | ORAL | 1 refills | Status: AC

## 2019-06-18 NOTE — Progress Notes (Signed)
Ryan Hoffman [id number] 1970/12/24 49 y.o.  Subjective:   Patient ID:  Ryan Hoffman is a 49 y.o. (DOB 1971/04/03) male.  Chief Complaint:  Chief Complaint  Patient presents with  . Anxiety  . Depression  . Other    bipolar 2 disorder, cannabis abuse    HPI Ryan Hoffman presents to the office today for follow-up of Bipolar disorder, anxiety disorder, THC, and insomnia.  Describes mood today as "ok". Pleasant. Mood symptoms - reports some depression, anxiety, and irritability - "not today". Stating "I feel pretty good". He and wife are living separately over past month or so. The 15 and 48 year old are with him permanently and shares custody with his. Stating "we get along sometimes and sometimes not". Reports having a few "outbursts". Stable interest and motivation. Taking medications as prescribed.   Energy levels stable. Active, does not have a regular exercise routine. Unemployed. Enjoys some usual interests and activities. Married, recently separated Lives with 3 children - 18, 16, and 10. Family in [place].  Appetite adequate. Weight loss - 38 pounds.  Sleeps well most nights. Averages 4 to 5 hours. Napping during the day. Not wearing his CPAP machine.  Focus and concentration stable. Completing tasks. Managing aspects of household. Denies SI or HI. Denies AH or VH.   Review of Systems:  Review of Systems  Musculoskeletal: Negative for gait problem.  Neurological: Negative for tremors.  Psychiatric/Behavioral:       Please refer to HPI    Medications: I have reviewed the patient's current medications.  Current Outpatient Medications  Medication Sig Dispense Refill  . acetaminophen (TYLENOL) 500 MG tablet Take 1,000 mg by mouth every 6 (six) hours as needed.    . ALPRAZolam (XANAX) 0.5 MG tablet Take 1 tablet (0.5 mg total) by mouth daily as needed for anxiety. 15 tablet 0  . cyclobenzaprine (FLEXERIL) 10 MG tablet Take 1 tablet (10 mg total) by mouth 2  (two) times daily as needed for muscle spasms. (Patient not taking: Reported on 12/04/2018) 30 tablet 0  . FLUoxetine (PROZAC) 20 MG capsule Take by mouth.    Marland Kitchen FLUoxetine (PROZAC) 20 MG capsule Take 1 capsule (20 mg total) by mouth daily. 30 capsule 2  . lamoTRIgine (LAMICTAL) 100 MG tablet 1/2 - 1 tablet qhs    . lamoTRIgine (LAMICTAL) 100 MG tablet Take 1 tablet (100 mg total) by mouth 2 (two) times daily. 60 tablet 2  . loratadine (CLARITIN) 10 MG tablet Take 10 mg by mouth daily.    Marland Kitchen loratadine (CLARITIN) 10 MG tablet Take by mouth.    . Multiple Vitamin (MULTIVITAMIN) capsule Take by mouth.    . Multiple Vitamins-Minerals (MULTIVITAMIN WITH MINERALS) tablet Take 1 tablet by mouth daily.    . QUEtiapine (SEROQUEL) 200 MG tablet Take by mouth.    . QUEtiapine (SEROQUEL) 200 MG tablet Take 1 tablet (200 mg total) by mouth at bedtime. 30 tablet 2  . QUEtiapine (SEROQUEL) 50 MG tablet Take 1 tablet (50 mg total) by mouth 2 (two) times daily. 60 tablet 2  . ranitidine (ZANTAC) 75 MG tablet Take 75 mg by mouth 2 (two) times daily.     No current facility-administered medications for this visit.    Medication Side Effects: None  Allergies: No Known Allergies  Past Medical History:  Diagnosis Date  . Allergy   . Anxiety   . Arthritis   . Bipolar 1 disorder (HCC)   . Chicken pox   .  GERD (gastroesophageal reflux disease)     Family History  Problem Relation Age of Onset  . Hyperlipidemia Mother   . Hyperlipidemia Father   . Hypertension Father   . Arthritis Maternal Grandmother   . Heart disease Maternal Grandmother   . Arthritis Maternal Grandfather   . Heart disease Maternal Grandfather   . Arthritis Paternal Grandmother   . Heart disease Paternal Grandmother   . Arthritis Paternal Grandfather   . Heart disease Paternal Grandfather     Social History   Socioeconomic History  . Marital status: Married    Spouse name: Not on file  . Number of children: 4  . Years of  education: 84  . Highest education level: Not on file  Occupational History  . Occupation: Clinical biochemist  Tobacco Use  . Smoking status: Former Smoker    Packs/day: 0.50    Types: Cigarettes    Quit date: 09/19/2018    Years since quitting: 0.7  . Smokeless tobacco: Current User    Types: Snuff  Substance and Sexual Activity  . Alcohol use: Yes    Alcohol/week: 0.0 standard drinks    Comment: occasionally, binge drinks  . Drug use: No  . Sexual activity: Not on file  Other Topics Concern  . Not on file  Social History Narrative   Denies religious beliefs effecting health care.    Social Determinants of Health   Financial Resource Strain:   . Difficulty of Paying Living Expenses: Not on file  Food Insecurity:   . Worried About Charity fundraiser in the Last Year: Not on file  . Ran Out of Food in the Last Year: Not on file  Transportation Needs:   . Lack of Transportation (Medical): Not on file  . Lack of Transportation (Non-Medical): Not on file  Physical Activity:   . Days of Exercise per Week: Not on file  . Minutes of Exercise per Session: Not on file  Stress:   . Feeling of Stress : Not on file  Social Connections:   . Frequency of Communication with Friends and Family: Not on file  . Frequency of Social Gatherings with Friends and Family: Not on file  . Attends Religious Services: Not on file  . Active Member of Clubs or Organizations: Not on file  . Attends Archivist Meetings: Not on file  . Marital Status: Not on file  Intimate Partner Violence:   . Fear of Current or Ex-Partner: Not on file  . Emotionally Abused: Not on file  . Physically Abused: Not on file  . Sexually Abused: Not on file    Past Medical History, Surgical history, Social history, and Family history were reviewed and updated as appropriate.   Please see review of systems for further details on the patient's review from today.   Objective:   Physical Exam:  There were no  vitals taken for this visit.  Physical Exam Constitutional:      General: He is not in acute distress. Musculoskeletal:        General: No deformity.  Neurological:     Mental Status: He is alert and oriented to person, place, and time.     Coordination: Coordination normal.  Psychiatric:        Attention and Perception: Attention and perception normal. He does not perceive auditory or visual hallucinations.        Mood and Affect: Mood is anxious and depressed. Affect is not labile, blunt, angry or inappropriate.  Speech: Speech normal.        Behavior: Behavior normal.        Thought Content: Thought content normal. Thought content is not paranoid or delusional. Thought content does not include homicidal or suicidal ideation. Thought content does not include homicidal or suicidal plan.        Cognition and Memory: Cognition and memory normal.        Judgment: Judgment normal.     Comments: Insight intact     Lab Review:     Component Value Date/Time   NA 136 09/13/2018 0912   K 4.0 09/13/2018 0912   CL 99 09/13/2018 0912   CO2 25 09/13/2018 0912   GLUCOSE 111 (H) 09/13/2018 0912   BUN 9 09/13/2018 0912   CREATININE 0.87 09/13/2018 0912   CALCIUM 9.0 09/13/2018 0912   PROT 7.7 09/13/2018 0912   ALBUMIN 4.5 09/13/2018 0912   AST 43 (H) 09/13/2018 0912   ALT 69 (H) 09/13/2018 0912   ALKPHOS 96 09/13/2018 0912   BILITOT 0.6 09/13/2018 0912   GFRNONAA >60 09/13/2018 0912   GFRAA >60 09/13/2018 0912       Component Value Date/Time   WBC 16.0 (H) 09/13/2018 0912   RBC 5.42 09/13/2018 0912   HGB 15.5 09/13/2018 0912   HCT 48.8 09/13/2018 0912   PLT 402 (H) 09/13/2018 0912   MCV 90.0 09/13/2018 0912   MCH 28.6 09/13/2018 0912   MCHC 31.8 09/13/2018 0912   RDW 13.1 09/13/2018 0912   LYMPHSABS 2.2 09/13/2018 0912   MONOABS 0.8 09/13/2018 0912   EOSABS 0.1 09/13/2018 0912   BASOSABS 0.1 09/13/2018 0912    No results found for: POCLITH, LITHIUM   Lab Results   Component Value Date   VALPROATE 24.9 (L) 07/02/2015     .res Assessment: Plan:    Plan:  1. Increase Seroquel 200mg  to 300mg  at hs  2. Continue Prozac 20mg  daily 3. Continue Lamictal 200mg  daily 4. Continue Xanax prn anxiety (dose unknown) - none needed this visit. 5. Seroquel 50mg  BID for mood instability - taking both in the morning - when he takes it  RTC 6 weeks.  Set up with therapist.   Genetic testing discussed.   Patient advised to contact office with any questions, adverse effects, or acute worsening in signs and symptoms.  Discussed potential metabolic side effects associated with atypical antipsychotics, as well as potential risk for movement side effects. Advised pt to contact office if movement side effects occur.   Monitor for any sign of rash. Please taking Lamictal and contact office immediately rash develops. Recommend seeking urgent medical attention if rash is severe and/or spreading quickly.  Discussed potential benefits, risk, and side effects of benzodiazepines to include potential risk of tolerance and dependence, as well as possible drowsiness.  Advised patient not to drive if experiencing drowsiness and to take lowest possible effective dose to minimize risk of dependence and tolerance.  Eh was seen today for anxiety, depression and other.  Diagnoses and all orders for this visit:  GAD (generalized anxiety disorder)  Cannabis abuse  Bipolar 1 disorder, mixed, moderate (HCC)  Insomnia, unspecified type     Please see After Visit Summary for patient specific instructions.  No future appointments.  No orders of the defined types were placed in this encounter.   -------------------------------

## 2019-06-26 MED FILL — QUETIAPINE FUMARATE 300 MG: 300 | 90 days supply | Qty: 90 | Fill #0

## 2019-06-26 MED FILL — QUETIAPINE FUMARATE 50 MG T: 50 | 90 days supply | Qty: 180 | Fill #0

## 2019-06-26 MED FILL — LAMOTRIGINE 100 MG TABS: 100 | 90 days supply | Qty: 180 | Fill #0

## 2019-06-26 MED FILL — ALPRAZolam 0.5 MG TABS: 0.5 | 30 days supply | Qty: 30 | Fill #0

## 2019-06-26 MED FILL — FLUoxetine HCL 20 MG CAPS: 20 | 90 days supply | Qty: 90 | Fill #0

## 2019-07-09 ENCOUNTER — Ambulatory Visit (INDEPENDENT_AMBULATORY_CARE_PROVIDER_SITE_OTHER): Payer: No Typology Code available for payment source | Admitting: Mental Health

## 2019-07-09 ENCOUNTER — Other Ambulatory Visit: Payer: Self-pay

## 2019-07-09 DIAGNOSIS — F3162 Bipolar disorder, current episode mixed, moderate: Secondary | ICD-10-CM

## 2019-07-09 DIAGNOSIS — F411 Generalized anxiety disorder: Secondary | ICD-10-CM
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
## 2019-07-25 ENCOUNTER — Ambulatory Visit (INDEPENDENT_AMBULATORY_CARE_PROVIDER_SITE_OTHER): Payer: No Typology Code available for payment source | Admitting: Mental Health

## 2019-07-25 DIAGNOSIS — F3162 Bipolar disorder, current episode mixed, moderate: Secondary | ICD-10-CM | POA: Diagnosis not present

## 2019-07-25 NOTE — Progress Notes (Signed)
[facility] Counselor Psychotherapy Note  Name: Ryan Hoffman Date: 07/25/2019 MRN: [id number] DOB: [birth year] PCP: Pincus Sanes, MD  Treatment: individual therapy  Mental Status Exam:   Appearance:   Casual     Behavior:  Appropriate  Motor:  Normal  Speech/Language:   Clear and Coherent  Affect:  Full range  Mood:  anxious and depressed  Thought process:  normal  Thought content:    WNL  Sensory/Perceptual disturbances:    none  Orientation:  x4  Attention:  Good  Concentration:  Good  Memory:  WNL  Fund of knowledge:   Good  Insight:    Fair  Judgment:   Fair  Impulse Control:  Fair   Reported Symptoms:  Mood swings, easily angered, sleep deficits (5 hours/night), depressed mood  Risk Assessment: Danger to Self:  No Self-injurious Behavior: No Danger to Others: No Duty to Warn:no Physical Aggression / Violence:No  Access to Firearms a concern: No  Gang Involvement:No  Patient / guardian was educated about steps to take if suicide or homicide risk level increases between visits: yes While future psychiatric events cannot be accurately predicted, the patient does not currently require acute inpatient psychiatric care and does not currently meet [facility] involuntary commitment criteria.     Family History  Problem Relation Age of Onset  . Hyperlipidemia Mother   . Hyperlipidemia Father   . Hypertension Father   . Arthritis Maternal Grandmother   . Heart disease Maternal Grandmother   . Arthritis Maternal Grandfather   . Heart disease Maternal Grandfather   . Arthritis Paternal Grandmother   . Heart disease Paternal Grandmother   . Arthritis Paternal Grandfather   . Heart disease Paternal Grandfather    Medical History/Surgical History:  Past Medical History:  Diagnosis Date  . Allergy   . Anxiety   . Arthritis   . Bipolar 1 disorder (HCC)   . Chicken pox   . GERD (gastroesophageal reflux disease)     Past Surgical History:  Procedure  Laterality Date  . ANTERIOR CRUCIATE LIGAMENT REPAIR    . ELBOW SURGERY    . KNEE SURGERY      Medications: Current Outpatient Medications  Medication Sig Dispense Refill  . acetaminophen (TYLENOL) 500 MG tablet Take 1,000 mg by mouth every 6 (six) hours as needed.    . ALPRAZolam (XANAX) 0.5 MG tablet Take 1 tablet (0.5 mg total) by mouth daily as needed for anxiety. 30 tablet 2  . cyclobenzaprine (FLEXERIL) 10 MG tablet Take 1 tablet (10 mg total) by mouth 2 (two) times daily as needed for muscle spasms. (Patient not taking: Reported on 12/04/2018) 30 tablet 0  . FLUoxetine (PROZAC) 20 MG capsule Take 1 capsule (20 mg total) by mouth daily. 90 capsule 1  . lamoTRIgine (LAMICTAL) 100 MG tablet Take 1 tablet (100 mg total) by mouth 2 (two) times daily. 180 tablet 1  . loratadine (CLARITIN) 10 MG tablet Take 10 mg by mouth daily.    . Multiple Vitamins-Minerals (MULTIVITAMIN WITH MINERALS) tablet Take 1 tablet by mouth daily.    . QUEtiapine (SEROQUEL) 300 MG tablet Take 1 tablet (300 mg total) by mouth at bedtime. 90 tablet 1  . QUEtiapine (SEROQUEL) 50 MG tablet Take 1 tablet (50 mg total) by mouth 2 (two) times daily. 180 tablet 1  . ranitidine (ZANTAC) 75 MG tablet Take 75 mg by mouth 2 (two) times daily.     No current facility-administered medications for this visit.  Subjective:   Patient presents with his wife initially per his request to provide more history and background.  She shared how he has mood swings, can get angry easily especially if he is using alcohol which he admitted to using from time to time.  She shared how the relationship at this point is changing, there and separation.  He shared how he Feels stressed often- "I want to do good by my family but I cannot" - mood be stable.  Stated his mood goes from calm, low mood for a few weeks then shifts.  Patient consented to have his wife join session initially to provide some feedback re; his needs and other concerns.  He  struggles to take  He wants to:  be able to handle his anger more effectively Decrease financial stress Maintain his exercise regimen to help his mood Improve his ability to not respond to his anger triggers   Diagnoses:    ICD-10-CM   1. Bipolar 1 disorder, mixed, moderate ([place])  F31.62     Plan of Care:  To be completed   Anson [place], [facility]

## 2019-07-30 ENCOUNTER — Ambulatory Visit (INDEPENDENT_AMBULATORY_CARE_PROVIDER_SITE_OTHER): Payer: No Typology Code available for payment source | Admitting: Adult Health

## 2019-07-30 ENCOUNTER — Encounter: Payer: Self-pay | Admitting: Adult Health

## 2019-07-30 DIAGNOSIS — F1210 Cannabis abuse, uncomplicated: Secondary | ICD-10-CM

## 2019-07-30 DIAGNOSIS — F3162 Bipolar disorder, current episode mixed, moderate: Secondary | ICD-10-CM | POA: Diagnosis not present

## 2019-07-30 DIAGNOSIS — G4700 Insomnia, unspecified: Secondary | ICD-10-CM

## 2019-07-30 DIAGNOSIS — F411 Generalized anxiety disorder: Secondary | ICD-10-CM | POA: Diagnosis not present

## 2019-07-30 MED ORDER — QUETIAPINE FUMARATE 400 MG PO TABS: 400.0000 mg | ORAL_TABLET | Freq: Every day | ORAL | 1 refills | Status: AC

## 2019-07-30 MED FILL — QUETIAPINE FUMARATE 400 MG: 400 | 90 days supply | Qty: 90 | Fill #0
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
## 2019-08-29 ENCOUNTER — Ambulatory Visit: Payer: No Typology Code available for payment source | Admitting: Mental Health

## 2019-08-29 DIAGNOSIS — G4700 Insomnia, unspecified: Secondary | ICD-10-CM

## 2019-08-29 NOTE — Progress Notes (Signed)
[facility] Psychotherapy Note  Name: Ryan Hoffman Date: 08/29/2019 MRN: [id number] DOB: 1971-01-12 PCP: Binnie Rail, MD  Treatment: individual therapy   Mental Status Exam:   Appearance:   Unable to assess  Behavior:  UTA  Motor:  UTA  Speech/Language:   Clear and Coherent  Affect:  Full range  Mood:  euthymic  Thought process:  normal  Thought content:    WNL  Sensory/Perceptual disturbances:    none  Orientation:  x4  Attention:  Good  Concentration:  Good  Memory:  WNL  Fund of knowledge:   Good  Insight:    developing  Judgment:   Fair  Impulse Control:  Fair   Reported Symptoms:  Mood swings, easily angered, sleep deficits (5 hours/night), depressed mood  Risk Assessment: Danger to Self:  No Self-injurious Behavior: No Danger to Others: No Duty to Warn:no Physical Aggression / Violence:No  Access to Firearms a concern: No  Gang Involvement:No  Patient / guardian was educated about steps to take if suicide or homicide risk level increases between visits: yes While future psychiatric events cannot be accurately predicted, the patient does not currently require acute inpatient psychiatric care and does not currently meet [facility] involuntary commitment criteria.  Medical History/Surgical History:  Past Medical History:  Diagnosis Date  . Allergy   . Anxiety   . Arthritis   . Bipolar 1 disorder ([place])   . Chicken pox   . GERD (gastroesophageal reflux disease)     Past Surgical History:  Procedure Laterality Date  . ANTERIOR CRUCIATE LIGAMENT REPAIR    . ELBOW SURGERY    . KNEE SURGERY      Medications: Current Outpatient Medications  Medication Sig Dispense Refill  . acetaminophen (TYLENOL) 500 MG tablet Take 1,000 mg by mouth every 6 (six) hours as needed.    . ALPRAZolam (XANAX) 0.5 MG tablet Take 1 tablet (0.5 mg total) by mouth daily as needed for anxiety. 30 tablet 2  . cyclobenzaprine (FLEXERIL) 10 MG tablet Take 1 tablet  (10 mg total) by mouth 2 (two) times daily as needed for muscle spasms. (Patient not taking: Reported on 12/04/2018) 30 tablet 0  . FLUoxetine (PROZAC) 20 MG capsule Take 1 capsule (20 mg total) by mouth daily. 90 capsule 1  . lamoTRIgine (LAMICTAL) 100 MG tablet Take 1 tablet (100 mg total) by mouth 2 (two) times daily. 180 tablet 1  . loratadine (CLARITIN) 10 MG tablet Take 10 mg by mouth daily.    . Multiple Vitamins-Minerals (MULTIVITAMIN WITH MINERALS) tablet Take 1 tablet by mouth daily.    . QUEtiapine (SEROQUEL) 400 MG tablet Take 1 tablet (400 mg total) by mouth at bedtime. 90 tablet 1  . QUEtiapine (SEROQUEL) 50 MG tablet Take 1 tablet (50 mg total) by mouth 2 (two) times daily. 180 tablet 1  . ranitidine (ZANTAC) 75 MG tablet Take 75 mg by mouth 2 (two) times daily.     No current facility-administered medications for this visit.    Subjective:   Patient engaged in telehealth session. He stated he and his wife are now separated. He stated he is doing well, feels his mood is "even" but is waiting for "something to happen because it always does". Being separated, he feels less anxious. He now living w/ friends a couple who have 2 children. He has abstained from using alcohol, reports feeling better physically. He stated he is now on SSDI and this has relieved a lot of stress.  We discussed some resources related to maintaining sobriety. He stated he had to end this session early.    Diagnoses:    ICD-10-CM   1. Insomnia, unspecified type  G47.00     Plan: Patient is to use CBT, mindfulness and coping skills to help manage decrease symptoms associated with their diagnosis.  Patient continues maintain sobriety and is to use his support system as needed.   Long-term goal:   Reduce overall level, frequency, and intensity of the feelings of depression and anxiety 7/10 to a 0-2/10 in severity for at least 3 consecutive months.   Short-term goal:  Decrease anger outbursts   Decrease mood  swings by taking meds as rx'd Improve communication skills toward improving relationships   Assessment of progress:  progressing   Waldron Session, [facility]

## 2019-09-10 MED FILL — ALPRAZolam 0.5 MG TABS: 0.5 | 30 days supply | Qty: 30 | Fill #1

## 2019-09-26 ENCOUNTER — Telehealth: Payer: Self-pay | Admitting: Adult Health

## 2019-09-26 NOTE — Telephone Encounter (Signed)
Requesting a RF of Xanax. Can send to the [facility]. ( I informed him in the future to call the pharmacy to get RF).

## 2019-09-27 NOTE — Telephone Encounter (Signed)
Patient's last refill was 09/10/2019 #30, patient not due yet for refill and he also have 1 refill on file to use at [facility].

## 2019-10-11 MED FILL — FLUoxetine HCL 20 MG CAPS: 20 | 90 days supply | Qty: 90 | Fill #1

## 2019-10-11 MED FILL — ALPRAZolam 0.5 MG TABS: 0.5 | 30 days supply | Qty: 30 | Fill #2

## 2019-10-22 ENCOUNTER — Emergency Department (HOSPITAL_COMMUNITY)
Admission: EM | Admit: 2019-10-22 | Discharge: 2019-10-22 | Disposition: A | Discharge status: Home / Self Care | Payer: Self-pay | Attending: Emergency Medicine | Admitting: Emergency Medicine

## 2019-10-22 ENCOUNTER — Encounter (HOSPITAL_COMMUNITY): Payer: Self-pay | Admitting: *Deleted

## 2019-10-22 ENCOUNTER — Other Ambulatory Visit: Payer: Self-pay

## 2019-10-22 DIAGNOSIS — F1590 Other stimulant use, unspecified, uncomplicated: Secondary | ICD-10-CM | POA: Insufficient documentation

## 2019-10-22 DIAGNOSIS — R55 Syncope and collapse: Secondary | ICD-10-CM | POA: Insufficient documentation

## 2019-10-22 DIAGNOSIS — F17210 Nicotine dependence, cigarettes, uncomplicated: Secondary | ICD-10-CM | POA: Insufficient documentation

## 2019-10-22 LAB — CBC WITH DIFFERENTIAL/PLATELET
Abs Immature Granulocytes: 0.03 10*3/uL (ref 0.00–0.07)
Basophils Absolute: 0.1 10*3/uL (ref 0.0–0.1)
Basophils Relative: 1 %
Eosinophils Absolute: 0.2 10*3/uL (ref 0.0–0.5)
Eosinophils Relative: 2 %
HCT: 48.9 % (ref 39.0–52.0)
Hemoglobin: 15.8 g/dL (ref 13.0–17.0)
Immature Granulocytes: 0 %
Lymphocytes Relative: 26 %
Lymphs Abs: 2.5 10*3/uL (ref 0.7–4.0)
MCH: 28.8 pg (ref 26.0–34.0)
MCHC: 32.3 g/dL (ref 30.0–36.0)
MCV: 89.2 fL (ref 80.0–100.0)
Monocytes Absolute: 0.7 10*3/uL (ref 0.1–1.0)
Monocytes Relative: 7 %
Neutro Abs: 6 10*3/uL (ref 1.7–7.7)
Neutrophils Relative %: 64 %
Platelets: 302 10*3/uL (ref 150–400)
RBC: 5.48 MIL/uL (ref 4.22–5.81)
RDW: 13.3 % (ref 11.5–15.5)
WBC: 9.3 10*3/uL (ref 4.0–10.5)
nRBC: 0 % (ref 0.0–0.2)

## 2019-10-22 LAB — COMPREHENSIVE METABOLIC PANEL
ALT: 30 U/L (ref 0–44)
AST: 18 U/L (ref 15–41)
Albumin: 4.3 g/dL (ref 3.5–5.0)
Alkaline Phosphatase: 87 U/L (ref 38–126)
Anion gap: 12 (ref 5–15)
BUN: 13 mg/dL (ref 6–20)
CO2: 24 mmol/L (ref 22–32)
Calcium: 8.7 mg/dL — ABNORMAL LOW (ref 8.9–10.3)
Chloride: 101 mmol/L (ref 98–111)
Creatinine, Ser: 1.24 mg/dL (ref 0.61–1.24)
GFR calc Af Amer: >60 mL/min (ref >60)
GFR calc non Af Amer: >60 mL/min (ref >60)
Glucose, Bld: 103 mg/dL — ABNORMAL HIGH (ref 70–99)
Potassium: 3.6 mmol/L (ref 3.5–5.1)
Sodium: 137 mmol/L (ref 135–145)
Total Bilirubin: 0.7 mg/dL (ref 0.3–1.2)
Total Protein: 7.5 g/dL (ref 6.5–8.1)

## 2019-10-22 LAB — ETHANOL: Alcohol, Ethyl (B): <10 mg/dL (ref <10)

## 2019-10-22 LAB — ACETAMINOPHEN LEVEL: Acetaminophen (Tylenol), Serum: <10 ug/mL — ABNORMAL LOW (ref 10–30)

## 2019-10-22 LAB — SALICYLATE LEVEL: Salicylate Lvl: <7 mg/dL — ABNORMAL LOW (ref 7.0–30.0)

## 2019-10-22 MED ORDER — SODIUM CHLORIDE 0.9 % IV BOLUS (SEPSIS)
2000.0000 mL | Freq: Once | INTRAVENOUS | Status: AC
  Administered 2019-10-22: 2000 mL via INTRAVENOUS

## 2019-10-22 NOTE — ED Provider Notes (Signed)
[facility] EMERGENCY DEPARTMENT Provider Note   CSN: [id number] Arrival date & time: 10/22/19  0032     History Chief Complaint  Patient presents with  . Loss of Consciousness    Ryan Hoffman is a 49 y.o. male.  The history is provided by the patient and a friend.  Loss of Consciousness Episode history:  Single Duration:  5 seconds Progression:  Improving Chronicity:  New Context: standing up   Witnessed: yes   Relieved by:  Lying down Worsened by:  Posture Associated symptoms: no chest pain, no fever, no focal weakness, no headaches, no seizures, no shortness of breath and no vomiting   Patient reports he was celebrating 4 July tonight.  He reports drinking alcohol and using methamphetamine.  He came home and took his nightly medications including Seroquel 700 mg.  He reports this is his usual dosing.  He then became hungry and stood up to get some chips, and then he had a brief syncopal episode.  It was witnessed by friend.  No seizures.  He was back to baseline within 5 seconds.  No acute traumatic injuries He had otherwise been at his baseline.  No chest pain or shortness of breath.  No significant headache     Past Medical History:  Diagnosis Date  . Allergy   . Anxiety   . Arthritis   . Bipolar 1 disorder ([facility])   . Chicken pox   . GERD (gastroesophageal reflux disease)     Patient Active Problem List   Diagnosis Date Noted  . GAD (generalized anxiety disorder) 12/26/2018  . Insomnia, unspecified 12/26/2018  . Cannabis abuse 12/26/2018  . Multiple closed fractures of ribs of right side 09/19/2018  . Acute pain of right shoulder 09/19/2018  . Elevated blood pressure reading 09/19/2018  . GERD (gastroesophageal reflux disease) 09/18/2018  . Tobacco abuse 09/18/2018  . Sleep apnea 01/27/2016  . Testosterone insufficiency 03/21/2015  . Bipolar 1 disorder, mixed, moderate ([facility]) 02/21/2015  . Right knee pain 02/21/2015    Past Surgical History:  Procedure  Laterality Date  . ANTERIOR CRUCIATE LIGAMENT REPAIR    . ELBOW SURGERY    . KNEE SURGERY         Family History  Problem Relation Age of Onset  . Hyperlipidemia Mother   . Hyperlipidemia Father   . Hypertension Father   . Arthritis Maternal Grandmother   . Heart disease Maternal Grandmother   . Arthritis Maternal Grandfather   . Heart disease Maternal Grandfather   . Arthritis Paternal Grandmother   . Heart disease Paternal Grandmother   . Arthritis Paternal Grandfather   . Heart disease Paternal Grandfather     Social History   Tobacco Use  . Smoking status: Current Some Day Smoker    Packs/day: 0.50    Types: Cigarettes    Last attempt to quit: 09/19/2018    Years since quitting: 1.0  . Smokeless tobacco: Current User    Types: Snuff  Vaping Use  . Vaping Use: Never used  Substance Use Topics  . Alcohol use: Yes    Alcohol/week: 0.0 standard drinks    Comment: occasionally, binge drinks  . Drug use: Yes    Types: Methamphetamines    Home Medications Prior to Admission medications   Medication Sig Start Date End Date Taking? Authorizing Provider  acetaminophen (TYLENOL) 500 MG tablet Take 1,000 mg by mouth every 6 (six) hours as needed.   Yes [provider]  ALPRAZolam Prudy Feeler) 0.5  MG tablet Take 1 tablet (0.5 mg total) by mouth daily as needed for anxiety. 06/18/19  Yes Mozingo, Thereasa Solo, NP  FLUoxetine (PROZAC) 20 MG capsule Take 1 capsule (20 mg total) by mouth daily. 06/18/19  Yes Mozingo, Thereasa Solo, NP  lamoTRIgine (LAMICTAL) 100 MG tablet Take 1 tablet (100 mg total) by mouth 2 (two) times daily. 06/18/19  Yes Mozingo, Thereasa Solo, NP  QUEtiapine (SEROQUEL) 400 MG tablet Take 1 tablet (400 mg total) by mouth at bedtime. 07/30/19  Yes Mozingo, Thereasa Solo, NP  QUEtiapine (SEROQUEL) 50 MG tablet Take 1 tablet (50 mg total) by mouth 2 (two) times daily. 06/18/19  Yes Mozingo, Thereasa Solo, NP  cyclobenzaprine (FLEXERIL) 10 MG tablet Take  1 tablet (10 mg total) by mouth 2 (two) times daily as needed for muscle spasms. Patient not taking: Reported on 12/04/2018 09/19/18   Pincus Sanes, MD  loratadine (CLARITIN) 10 MG tablet Take 10 mg by mouth daily.    [provider]  Multiple Vitamins-Minerals (MULTIVITAMIN WITH MINERALS) tablet Take 1 tablet by mouth daily.    [provider]  ranitidine (ZANTAC) 75 MG tablet Take 75 mg by mouth 2 (two) times daily.    [provider]    Allergies    Patient has no known allergies.  Review of Systems   Review of Systems  Constitutional: Negative for fever.  Respiratory: Negative for shortness of breath.   Cardiovascular: Positive for syncope. Negative for chest pain.  Gastrointestinal: Negative for diarrhea and vomiting.  Neurological: Positive for syncope. Negative for focal weakness, seizures and headaches.  All other systems reviewed and are negative.   Physical Exam Updated Vital Signs BP 115/79   Pulse (!) 114   Temp 98.3 F (36.8 C) (Oral)   Resp 19   Ht 1.676 m (5\' 6" )   Wt 90.7 kg   SpO2 96%   BMI 32.28 kg/m   Physical Exam CONSTITUTIONAL: Disheveled, no acute distress HEAD: Normocephalic/atraumatic EYES: EOMI/PERRL ENMT: Mucous membranes dry, no tongue laceration NECK: supple no meningeal signs SPINE/BACK:entire spine nontender, no bruising/crepitance/stepoffs noted to spine CV: S1/S2 noted, no murmurs/rubs/gallops noted, tachycardic LUNGS: Lungs are clear to auscultation bilaterally, no apparent distress ABDOMEN: soft, nontender, no rebound or guarding, bowel sounds noted throughout abdomen GU:no cva tenderness NEURO: Pt is awake/alert/appropriate, moves all extremitiesx4.  No facial droop.  No arm or leg drift.  GCS 15 EXTREMITIES: pulses normal/equal, full ROM, no deformities or tenderness SKIN: warm, color normal PSYCH: no abnormalities of mood noted, alert and oriented to situation  ED Results / Procedures / Treatments    Labs (all labs ordered are listed, but only abnormal results are displayed) Labs Reviewed  COMPREHENSIVE METABOLIC PANEL - Abnormal; Notable for the following components:      Result Value   Glucose, Bld 103 (*)    Calcium 8.7 (*)    All other components within normal limits  ACETAMINOPHEN LEVEL - Abnormal; Notable for the following components:   Acetaminophen (Tylenol), Serum <10 (*)    All other components within normal limits  SALICYLATE LEVEL - Abnormal; Notable for the following components:   Salicylate Lvl <7.0 (*)    All other components within normal limits  CBC WITH DIFFERENTIAL/PLATELET  ETHANOL  RAPID URINE DRUG SCREEN, HOSP PERFORMED    EKG EKG Interpretation  Date/Time:  Monday October 22 2019 00:42:41 EDT Ventricular Rate:  117 PR Interval:    QRS Duration: 76 QT Interval:  312 QTC Calculation: 436 R  Axis:   24 Text Interpretation: Sinus tachycardia Anterior infarct, old No previous ECGs available Confirmed by Zadie Rhine ([id number]) on 10/22/2019 12:46:43 AM   Radiology No results found.  Procedures Procedures   Medications Ordered in ED Medications  sodium chloride 0.9 % bolus 2,000 mL (2,000 mLs Intravenous New Bag/Given 10/22/19 0059)    ED Course  I have reviewed the triage vital signs and the nursing notes.  Pertinent labs & imaging results that were available during my care of the patient were reviewed by me and considered in my medical decision making (see chart for details).    MDM Rules/Calculators/A&P                         1:55 AM Suspect this is combination of overmedication in conjunction with substance abuse.  He is back to baseline.  No signs of acute traumatic injury, no indication for imaging.  No acute EKG changes. Will hydrate and reassess Patient will need to go back to his original dosing of Seroquel which is listed at 400 mg nightly  This patient presents to the ED for concern of syncope, this involves an extensive number of  treatment options, and is a complaint that carries with it a high risk of complications and morbidity.  The differential diagnosis includes cardiac arrhythmia, dehydration, orthostatic hypotension, overmedication   Lab Tests:   I Ordered, reviewed, and interpreted labs, which included electrolytes, acetaminophen, salicylate level, complete blood count, alcohol level  Medicines ordered:   I ordered medication IV fluids for dehydration    Additional history obtained:   Additional history obtained from friend  Previous records obtained and reviewed    Reevaluation:  After the interventions stated above, I reevaluated the patient and found patient is improved and ambulatory  He is requesting discharge Final Clinical Impression(s) / ED Diagnoses Final diagnoses:  Syncope and collapse    Rx / DC Orders ED Discharge Orders    None       Zadie Rhine, MD 10/22/19 0210

## 2019-10-22 NOTE — ED Triage Notes (Signed)
Pt c/o " I passed out tonight" pt states that he was getting up to get a bag of chips when he passed out and woke up with chest pain, admits to meth and etoh use tonight,

## 2019-10-22 NOTE — Discharge Instructions (Signed)
Please take Seroquel 400 milligrams at night as directed

## 2019-10-22 NOTE — ED Notes (Signed)
Pt states he feels better and is ready to go. EDP notified.

## 2019-11-09 ENCOUNTER — Other Ambulatory Visit: Payer: Self-pay | Admitting: Adult Health

## 2019-11-09 DIAGNOSIS — F411 Generalized anxiety disorder: Secondary | ICD-10-CM

## 2019-11-09 MED FILL — ALPRAZolam 0.5 MG TABS: 0.5 | 30 days supply | Qty: 30 | Fill #0

## 2019-11-15 ENCOUNTER — Ambulatory Visit: Payer: No Typology Code available for payment source | Admitting: Adult Health

## 2019-11-16 ENCOUNTER — Ambulatory Visit: Payer: No Typology Code available for payment source | Admitting: Mental Health
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
## 2019-12-10 MED FILL — ALPRAZolam 0.5 MG TABS: 0.5 | 30 days supply | Qty: 30 | Fill #1

## 2020-01-21 MED FILL — ALPRAZolam 0.5 MG TABS: 0.5 | 30 days supply | Qty: 30 | Fill #2

## 2020-06-19 ENCOUNTER — Other Ambulatory Visit: Payer: Self-pay | Admitting: Adult Health

## 2020-06-19 DIAGNOSIS — F411 Generalized anxiety disorder: Secondary | ICD-10-CM

## 2020-06-24 ENCOUNTER — Other Ambulatory Visit: Payer: Self-pay | Admitting: Adult Health

## 2020-06-24 DIAGNOSIS — F411 Generalized anxiety disorder: Secondary | ICD-10-CM

## 2020-09-25 ENCOUNTER — Ambulatory Visit
Admission: EM | Admit: 2020-09-25 | Discharge: 2020-09-25 | Disposition: A | Discharge status: Home / Self Care | Payer: Self-pay | Attending: Internal Medicine | Admitting: Internal Medicine

## 2020-09-25 ENCOUNTER — Other Ambulatory Visit: Payer: Self-pay

## 2020-09-25 ENCOUNTER — Encounter: Payer: Self-pay | Admitting: Emergency Medicine

## 2020-09-25 ENCOUNTER — Ambulatory Visit: Payer: Self-pay

## 2020-09-25 DIAGNOSIS — L03113 Cellulitis of right upper limb: Secondary | ICD-10-CM

## 2020-09-25 MED ORDER — TETANUS-DIPHTH-ACELL PERTUSSIS 5-2.5-18.5 LF-MCG/0.5 IM SUSY
0.5000 mL | PREFILLED_SYRINGE | Freq: Once | INTRAMUSCULAR | Status: AC
  Administered 2020-09-25: 0.5 mL via INTRAMUSCULAR

## 2020-09-25 MED ORDER — CEPHALEXIN 500 MG PO CAPS
500.0000 mg | ORAL_CAPSULE | Freq: Three times a day (TID) | ORAL | 0 refills | Status: AC
Start: 2020-09-25 — End: 2020-09-30

## 2020-09-25 NOTE — Discharge Instructions (Addendum)
Please take antibiotics as prescribed If you notice worsening swelling, fever, chills please return to the urgent care for re-evaluation

## 2020-09-25 NOTE — ED Provider Notes (Signed)
[facility]    CSN: [id number] Arrival date & time: 09/25/20  1538      History   Chief Complaint Chief Complaint  Patient presents with   Hand Pain    HPI Ryan Hoffman is a 50 y.o. male comes to the urgent care with complaints of painful swelling of the right hand.  Patient accidentally cut the dorsum of his left hand last week whilst doing some work at home.  He cleaned the area but has no pain applying any form of treatment to it.  He comes in complaining of increasing redness, swelling, pain and warmth over the area.  He started taking ibuprofen with no improvement in symptoms.  No discharge from the dorsum of the right hand.  Patient has not had Tdap over 10 years.   HPI  Past Medical History:  Diagnosis Date   Allergy    Anxiety    Arthritis    Bipolar 1 disorder (HCC)    Chicken pox    GERD (gastroesophageal reflux disease)     Patient Active Problem List   Diagnosis Date Noted   GAD (generalized anxiety disorder) 12/26/2018   Insomnia, unspecified 12/26/2018   Cannabis abuse 12/26/2018   Multiple closed fractures of ribs of right side 09/19/2018   Acute pain of right shoulder 09/19/2018   Elevated blood pressure reading 09/19/2018   GERD (gastroesophageal reflux disease) 09/18/2018   Tobacco abuse 09/18/2018   Sleep apnea 01/27/2016   Testosterone insufficiency 03/21/2015   Bipolar 1 disorder, mixed, moderate (HCC) 02/21/2015   Right knee pain 02/21/2015    Past Surgical History:  Procedure Laterality Date   ANTERIOR CRUCIATE LIGAMENT REPAIR     ELBOW SURGERY     KNEE SURGERY         Home Medications    Prior to Admission medications   Medication Sig Start Date End Date Taking? Authorizing Provider  cephALEXin (KEFLEX) 500 MG capsule Take 1 capsule (500 mg total) by mouth 3 (three) times daily for 5 days. 09/25/20 09/30/20 Yes Lachlan Pelto, Britta Mccreedy, MD  acetaminophen (TYLENOL) 500 MG tablet Take 1,000 mg by mouth every 6 (six) hours as  needed.    [provider]  ALPRAZolam Prudy Feeler) 0.5 MG tablet TAKE 1 TABLET (0.5 MG TOTAL) BY MOUTH DAILY AS NEEDED FOR ANXIETY. 11/09/19   Mozingo, Thereasa Solo, NP  FLUoxetine (PROZAC) 20 MG capsule Take 1 capsule (20 mg total) by mouth daily. 06/18/19   Mozingo, Thereasa Solo, NP  lamoTRIgine (LAMICTAL) 100 MG tablet Take 1 tablet (100 mg total) by mouth 2 (two) times daily. 06/18/19   Mozingo, Thereasa Solo, NP  loratadine (CLARITIN) 10 MG tablet Take 10 mg by mouth daily.    [provider]  Multiple Vitamins-Minerals (MULTIVITAMIN WITH MINERALS) tablet Take 1 tablet by mouth daily.    [provider]  QUEtiapine (SEROQUEL) 400 MG tablet Take 1 tablet (400 mg total) by mouth at bedtime. 07/30/19   Mozingo, Thereasa Solo, NP  QUEtiapine (SEROQUEL) 50 MG tablet Take 1 tablet (50 mg total) by mouth 2 (two) times daily. 06/18/19   Mozingo, Thereasa Solo, NP  ranitidine (ZANTAC) 75 MG tablet Take 75 mg by mouth 2 (two) times daily.    [provider]    Family History Family History  Problem Relation Age of Onset   Hyperlipidemia Mother    Hyperlipidemia Father    Hypertension Father    Arthritis Maternal Grandmother    Heart disease Maternal Grandmother  Arthritis Maternal Grandfather    Heart disease Maternal Grandfather    Arthritis Paternal Grandmother    Heart disease Paternal Grandmother    Arthritis Paternal Grandfather    Heart disease Paternal Grandfather     Social History Social History   Tobacco Use   Smoking status: Some Days    Packs/day: 0.50    Pack years: 0.00    Types: Cigarettes    Last attempt to quit: 09/19/2018    Years since quitting: 2.0   Smokeless tobacco: Current    Types: Snuff  Vaping Use   Vaping Use: Never used  Substance Use Topics   Alcohol use: Yes    Alcohol/week: 0.0 standard drinks    Comment: occasionally, binge drinks   Drug use: Yes    Types: Methamphetamines     Allergies   Patient has no  known allergies.   Review of Systems Review of Systems  Musculoskeletal: Negative.   Skin:  Positive for color change and wound.  Neurological: Negative.     Physical Exam Triage Vital Signs ED Triage Vitals  Enc Vitals Group     BP 09/25/20 1629 (!) 152/90     Pulse Rate 09/25/20 1629 93     Resp 09/25/20 1629 16     Temp 09/25/20 1629 97.6 F (36.4 C)     Temp Source 09/25/20 1629 Temporal     SpO2 09/25/20 1629 96 %     Weight --      Height --      Head Circumference --      Peak Flow --      Pain Score 09/25/20 1630 0     Pain Loc --      Pain Edu? --      Excl. in GC? --    No data found.  Updated Vital Signs BP (!) 152/90 (BP Location: Right Arm)   Pulse 93   Temp 97.6 F (36.4 C) (Temporal)   Resp 16   SpO2 96%   Visual Acuity Right Eye Distance:   Left Eye Distance:   Bilateral Distance:    Right Eye Near:   Left Eye Near:    Bilateral Near:     Physical Exam Vitals and nursing note reviewed.  Constitutional:      Appearance: Normal appearance.  Abdominal:     General: Bowel sounds are normal.     Palpations: Abdomen is soft.  Musculoskeletal:        General: Swelling and tenderness present.  Skin:    General: Skin is warm.     Capillary Refill: Capillary refill takes less than 2 seconds.     Findings: Erythema present.  Neurological:     Mental Status: He is alert.     UC Treatments / Results  Labs (all labs ordered are listed, but only abnormal results are displayed) Labs Reviewed - No data to display  EKG   Radiology No results found.  Procedures Procedures (including critical care time)  Medications Ordered in UC Medications  Tdap (BOOSTRIX) injection 0.5 mL (0.5 mLs Intramuscular Given 09/25/20 1700)    Initial Impression / Assessment and Plan / UC Course  I have reviewed the triage vital signs and the nursing notes.  Pertinent labs & imaging results that were available during my care of the patient were reviewed by  me and considered in my medical decision making (see chart for details).     Cellulitis of the right hand: Tdap booster given Daily  wound dressing changes Keflex for 5 days Gentle range of motion exercises of the right to prevent joint stiffness NSAIDs as needed for pain Return to urgent care if symptoms worsen. Final Clinical Impressions(s) / UC Diagnoses   Final diagnoses:  Cellulitis of right hand     Discharge Instructions      Please take antibiotics as prescribed If you notice worsening swelling, fever, chills please return to the urgent care for re-evaluation     ED Prescriptions     Medication Sig Dispense Auth. Provider   cephALEXin (KEFLEX) 500 MG capsule Take 1 capsule (500 mg total) by mouth 3 (three) times daily for 5 days. 15 capsule Lyris Hitchman, Britta Mccreedy, MD      PDMP not reviewed this encounter.   Merrilee Jansky, MD 09/28/20 1124

## 2020-09-25 NOTE — ED Triage Notes (Signed)
Pt here for right hand infection/pain  Reports he cut his hand last week w/his pocket knife   Sx today include redness, swelling, pain, localized fever  Denies fevers  Taking OTC Ibuprofen  A&O x4... NAD.Ryan Hoffman. ambulatory

## 2021-02-20 ENCOUNTER — Emergency Department (HOSPITAL_COMMUNITY)
Admission: EM | Admit: 2021-02-20 | Discharge: 2021-02-20 | Disposition: A | Discharge status: Home / Self Care | Payer: Self-pay | Attending: Emergency Medicine | Admitting: Emergency Medicine

## 2021-02-20 ENCOUNTER — Encounter (HOSPITAL_COMMUNITY): Payer: Self-pay

## 2021-02-20 ENCOUNTER — Emergency Department (HOSPITAL_COMMUNITY): Payer: Self-pay

## 2021-02-20 ENCOUNTER — Other Ambulatory Visit: Payer: Self-pay

## 2021-02-20 DIAGNOSIS — R0789 Other chest pain: Secondary | ICD-10-CM | POA: Insufficient documentation

## 2021-02-20 DIAGNOSIS — R079 Chest pain, unspecified: Secondary | ICD-10-CM

## 2021-02-20 DIAGNOSIS — R209 Unspecified disturbances of skin sensation: Secondary | ICD-10-CM | POA: Insufficient documentation

## 2021-02-20 DIAGNOSIS — F17210 Nicotine dependence, cigarettes, uncomplicated: Secondary | ICD-10-CM | POA: Insufficient documentation

## 2021-02-20 DIAGNOSIS — M79601 Pain in right arm: Secondary | ICD-10-CM | POA: Insufficient documentation

## 2021-02-20 LAB — BASIC METABOLIC PANEL
Anion gap: 7 (ref 5–15)
BUN: 8 mg/dL (ref 6–20)
CO2: 28 mmol/L (ref 22–32)
Calcium: 8.9 mg/dL (ref 8.9–10.3)
Chloride: 101 mmol/L (ref 98–111)
Creatinine, Ser: 0.93 mg/dL (ref 0.61–1.24)
GFR, Estimated: >60 mL/min (ref >60)
Glucose, Bld: 95 mg/dL (ref 70–99)
Potassium: 4.1 mmol/L (ref 3.5–5.1)
Sodium: 136 mmol/L (ref 135–145)

## 2021-02-20 LAB — CBC
HCT: 47.5 % (ref 39.0–52.0)
Hemoglobin: 15.9 g/dL (ref 13.0–17.0)
MCH: 30 pg (ref 26.0–34.0)
MCHC: 33.5 g/dL (ref 30.0–36.0)
MCV: 89.6 fL (ref 80.0–100.0)
Platelets: 296 10*3/uL (ref 150–400)
RBC: 5.3 MIL/uL (ref 4.22–5.81)
RDW: 13 % (ref 11.5–15.5)
WBC: 7.8 10*3/uL (ref 4.0–10.5)
nRBC: 0 % (ref 0.0–0.2)

## 2021-02-20 LAB — TROPONIN I (HIGH SENSITIVITY)
Troponin I (High Sensitivity): 2 ng/L (ref <18)
Troponin I (High Sensitivity): 3 ng/L (ref <18)

## 2021-02-20 NOTE — ED Provider Notes (Signed)
[facility] EMERGENCY DEPARTMENT Provider Note   CSN: [id number] Arrival date & time: 02/20/21  1533     History Chief Complaint  Patient presents with   Chest Pain    Ryan Hoffman is a 50 y.o. male with history of methamphetamine use, bipolar, and panic attacks who presents to the emergency department with diffuse chest pain characterized as a tight sensation that began around 10 AM this morning.  Patient woke up around 5 AM feeling well and as the morning progressed began feeling chest tightness.  His tightness radiates down the right arm he describes this sensation as numbness.  He reports associated nausea but denies any fever, chills, abdominal pain, vomiting, diarrhea, lower leg numbness/weakness, facial numbness/weakness.  Last used methamphetamine 2 weeks ago via intranasally.  Does not drink on a daily basis.  Currently rates his chest tightness a 6/10 in severity.  Has not taken anything for the pain.  Nothing seems to make it better or worse.   Chest Pain     Past Medical History:  Diagnosis Date   Allergy    Anxiety    Arthritis    Bipolar 1 disorder ([place])    Chicken pox    GERD (gastroesophageal reflux disease)     Patient Active Problem List   Diagnosis Date Noted   GAD (generalized anxiety disorder) 12/26/2018   Insomnia, unspecified 12/26/2018   Cannabis abuse 12/26/2018   Multiple closed fractures of ribs of right side 09/19/2018   Acute pain of right shoulder 09/19/2018   Elevated blood pressure reading 09/19/2018   GERD (gastroesophageal reflux disease) 09/18/2018   Tobacco abuse 09/18/2018   Sleep apnea 01/27/2016   Testosterone insufficiency 03/21/2015   Bipolar 1 disorder, mixed, moderate ([place]) 02/21/2015   Right knee pain 02/21/2015    Past Surgical History:  Procedure Laterality Date   ANTERIOR CRUCIATE LIGAMENT REPAIR     ELBOW SURGERY     KNEE SURGERY         Family History  Problem Relation Age of Onset   Hyperlipidemia Mother     Hyperlipidemia Father    Hypertension Father    Arthritis Maternal Grandmother    Heart disease Maternal Grandmother    Arthritis Maternal Grandfather    Heart disease Maternal Grandfather    Arthritis Paternal Grandmother    Heart disease Paternal Grandmother    Arthritis Paternal Grandfather    Heart disease Paternal Grandfather     Social History   Tobacco Use   Smoking status: Every Day    Packs/day: 1.00    Types: Cigarettes    Last attempt to quit: 09/19/2018    Years since quitting: 2.4   Smokeless tobacco: Current    Types: Snuff  Vaping Use   Vaping Use: Never used  Substance Use Topics   Alcohol use: Yes    Alcohol/week: 0.0 standard drinks    Comment: occasionally, binge drinks   Drug use: Yes    Types: Methamphetamines, Marijuana    Comment: last used meth couple weeks ago.    Home Medications Prior to Admission medications   Medication Sig Start Date End Date Taking? Authorizing Provider  acetaminophen (TYLENOL) 500 MG tablet Take 1,000 mg by mouth every 6 (six) hours as needed. Patient not taking: Reported on 02/20/2021    [provider]  ALPRAZolam (XANAX) 0.5 MG tablet TAKE 1 TABLET (0.5 MG TOTAL) BY MOUTH DAILY AS NEEDED FOR ANXIETY. Patient not taking: Reported on 02/20/2021 11/09/19   Mozingo, [employer]  Wilhemina Bonito, NP  FLUoxetine (PROZAC) 20 MG capsule Take 1 capsule (20 mg total) by mouth daily. Patient not taking: Reported on 02/20/2021 06/18/19   Mozingo, Thereasa Solo, NP  lamoTRIgine (LAMICTAL) 100 MG tablet Take 1 tablet (100 mg total) by mouth 2 (two) times daily. Patient not taking: Reported on 02/20/2021 06/18/19   Mozingo, Thereasa Solo, NP  loratadine (CLARITIN) 10 MG tablet Take 10 mg by mouth daily. Patient not taking: Reported on 02/20/2021    [provider]  Multiple Vitamins-Minerals (MULTIVITAMIN WITH MINERALS) tablet Take 1 tablet by mouth daily. Patient not taking: Reported on 02/20/2021    [provider]   QUEtiapine (SEROQUEL) 400 MG tablet Take 1 tablet (400 mg total) by mouth at bedtime. Patient not taking: Reported on 02/20/2021 07/30/19   Mozingo, Thereasa Solo, NP  QUEtiapine (SEROQUEL) 50 MG tablet Take 1 tablet (50 mg total) by mouth 2 (two) times daily. Patient not taking: Reported on 02/20/2021 06/18/19   Mozingo, Thereasa Solo, NP  ranitidine (ZANTAC) 75 MG tablet Take 75 mg by mouth 2 (two) times daily. Patient not taking: Reported on 02/20/2021    [provider]    Allergies    Patient has no known allergies.  Review of Systems   Review of Systems  Cardiovascular:  Positive for chest pain.  All other systems reviewed and are negative.  Physical Exam Updated Vital Signs BP (!) 159/92 (BP Location: Left Arm)   Pulse 81   Temp 98.3 F (36.8 C) (Oral)   Resp 16   Ht 5\' 6"  (1.676 m)   Wt 81.6 kg   SpO2 100%   BMI 29.05 kg/m   Physical Exam Vitals and nursing note reviewed.  Constitutional:      General: He is not in acute distress.    Appearance: Normal appearance.  HENT:     Head: Normocephalic and atraumatic.  Eyes:     General:        Right eye: No discharge.        Left eye: No discharge.  Cardiovascular:     Comments: Regular rate and rhythm.  S1/S2 are distinct without any evidence of murmur, rubs, or gallops.  Radial pulses are 2+ bilaterally.  Dorsalis pedis pulses are 2+ bilaterally.  No evidence of pedal edema. Pulmonary:     Comments: Clear to auscultation bilaterally.  Normal effort.  No respiratory distress.  No evidence of wheezes, rales, or rhonchi heard throughout. Chest:     Comments: Chest wall is nontender to palpation.  Abdominal:     General: Abdomen is flat. Bowel sounds are normal. There is no distension.     Tenderness: There is no abdominal tenderness. There is no guarding or rebound.  Musculoskeletal:        General: Normal range of motion.     Cervical back: Neck supple.  Skin:    General: Skin is warm and dry.      Findings: No rash.  Neurological:     General: No focal deficit present.     Mental Status: He is alert.  Psychiatric:        Mood and Affect: Mood normal.        Behavior: Behavior normal.    ED Results / Procedures / Treatments   Labs (all labs ordered are listed, but only abnormal results are displayed) Labs Reviewed  BASIC METABOLIC PANEL  CBC  TROPONIN I (HIGH SENSITIVITY)  TROPONIN I (HIGH SENSITIVITY)    EKG None  Radiology  DG Chest 2 View  Result Date: 02/20/2021 CLINICAL DATA:  Chest pain.  Right arm tingling. EXAM: CHEST - 2 VIEW COMPARISON:  None. FINDINGS: Cardiac silhouette is normal in size. Normal mediastinal and hilar contours. Clear lungs.  No pleural effusion or pneumothorax. Skeletal structures are unremarkable. IMPRESSION: No active cardiopulmonary disease. Electronically Signed   By: Lajean Manes M.D.   On: 02/20/2021 16:11    Procedures Procedures   Medications Ordered in ED Medications - No data to display  ED Course  I have reviewed the triage vital signs and the nursing notes.  Pertinent labs & imaging results that were available during my care of the patient were reviewed by me and considered in my medical decision making (see chart for details).    MDM Rules/Calculators/A&P                          ROKO RAYAN is a 50 y.o. male with history of methamphetamine use and panic disorder who presents the emergency department for further evaluation of chest pain.  His exam is without any evidence of volume overload and I have a low suspicion for heart failure at this time.  EKG was without any signs of active ischemia.  Given the timing and presentation of a low suspicion for ACS at this time.  Single troponin and delta troponin were both negative.  His history and physical exam is not consistent with pulmonary embolism at this time chest x-ray did not reveal any pneumothorax.  I have a low suspicion for thoracic aortic dissection, pericarditis,  pneumonia.  Patient has a heart score of 3.  Strict return precautions were given.  I will have him follow-up with his primary care doctor for further evaluation.  He is safe for discharge.   Final Clinical Impression(s) / ED Diagnoses Final diagnoses:  Chest pain, unspecified type    Rx / DC Orders ED Discharge Orders     None        Hendricks Limes, [place] 02/21/21 [id number]    Noemi Chapel, MD 02/21/21 1406

## 2021-02-20 NOTE — Discharge Instructions (Signed)
Please establish care with a primary care provider for follow-up.  Your work-up here today did not reveal any significant signs of heart damage or lung damage.  Please return to the emergency department if you experience worsening chest pain, intractable nausea/vomiting, loss of consciousness, severe shortness of breath, or any other concerns you may have.

## 2021-02-20 NOTE — ED Triage Notes (Signed)
Pt presents to ED with complaints mid chest pressure with right arm tingling since 1000.

## 2021-11-27 ENCOUNTER — Other Ambulatory Visit (HOSPITAL_COMMUNITY): Payer: Self-pay | Admitting: Family Medicine

## 2021-11-27 ENCOUNTER — Other Ambulatory Visit: Payer: Self-pay | Admitting: Family Medicine

## 2021-11-27 DIAGNOSIS — E785 Hyperlipidemia, unspecified: Secondary | ICD-10-CM

## 2022-01-01 ENCOUNTER — Encounter (HOSPITAL_BASED_OUTPATIENT_CLINIC_OR_DEPARTMENT_OTHER): Payer: Self-pay

## 2022-01-01 ENCOUNTER — Inpatient Hospital Stay (HOSPITAL_BASED_OUTPATIENT_CLINIC_OR_DEPARTMENT_OTHER): Admission: RE | Admit: 2022-01-01 | Payer: Self-pay | Source: Ambulatory Visit

## 2022-01-25 ENCOUNTER — Encounter (HOSPITAL_BASED_OUTPATIENT_CLINIC_OR_DEPARTMENT_OTHER): Payer: Self-pay

## 2022-01-25 DIAGNOSIS — R0681 Apnea, not elsewhere classified: Secondary | ICD-10-CM

## 2022-03-26 ENCOUNTER — Other Ambulatory Visit (HOSPITAL_BASED_OUTPATIENT_CLINIC_OR_DEPARTMENT_OTHER): Payer: Self-pay

## 2022-06-11 ENCOUNTER — Other Ambulatory Visit (HOSPITAL_COMMUNITY): Payer: Self-pay | Admitting: Internal Medicine

## 2022-06-11 DIAGNOSIS — E785 Hyperlipidemia, unspecified: Secondary | ICD-10-CM

## 2022-08-06 ENCOUNTER — Ambulatory Visit (HOSPITAL_COMMUNITY): Admission: RE | Admit: 2022-08-06 | Payer: No Typology Code available for payment source | Source: Ambulatory Visit

## 2022-08-06 ENCOUNTER — Encounter (HOSPITAL_COMMUNITY): Payer: Self-pay

## 2022-09-12 ENCOUNTER — Encounter (HOSPITAL_COMMUNITY): Payer: Self-pay | Admitting: Emergency Medicine

## 2022-09-12 ENCOUNTER — Other Ambulatory Visit: Payer: Self-pay

## 2022-09-12 ENCOUNTER — Emergency Department (HOSPITAL_COMMUNITY)
Admission: EM | Admit: 2022-09-12 | Discharge: 2022-09-13 | Disposition: A | Discharge status: Home / Self Care | Payer: 59 | Attending: Emergency Medicine | Admitting: Emergency Medicine

## 2022-09-12 ENCOUNTER — Emergency Department (HOSPITAL_COMMUNITY): Payer: 59

## 2022-09-12 DIAGNOSIS — R072 Precordial pain: Secondary | ICD-10-CM | POA: Insufficient documentation

## 2022-09-12 DIAGNOSIS — E876 Hypokalemia: Secondary | ICD-10-CM | POA: Diagnosis not present

## 2022-09-12 DIAGNOSIS — R0789 Other chest pain: Secondary | ICD-10-CM | POA: Diagnosis present

## 2022-09-12 LAB — CBC
HCT: 45.7 % (ref 39.0–52.0)
Hemoglobin: 15.1 g/dL (ref 13.0–17.0)
MCH: 28.4 pg (ref 26.0–34.0)
MCHC: 33 g/dL (ref 30.0–36.0)
MCV: 86.1 fL (ref 80.0–100.0)
Platelets: 311 10*3/uL (ref 150–400)
RBC: 5.31 MIL/uL (ref 4.22–5.81)
RDW: 13.2 % (ref 11.5–15.5)
WBC: 9.2 10*3/uL (ref 4.0–10.5)
nRBC: 0 % (ref 0.0–0.2)

## 2022-09-12 LAB — BASIC METABOLIC PANEL
Anion gap: 14 (ref 5–15)
BUN: 22 mg/dL — ABNORMAL HIGH (ref 6–20)
CO2: 21 mmol/L — ABNORMAL LOW (ref 22–32)
Calcium: 8.6 mg/dL — ABNORMAL LOW (ref 8.9–10.3)
Chloride: 102 mmol/L (ref 98–111)
Creatinine, Ser: 1.02 mg/dL (ref 0.61–1.24)
GFR, Estimated: >60 mL/min (ref >60)
Glucose, Bld: 165 mg/dL — ABNORMAL HIGH (ref 70–99)
Potassium: 3.4 mmol/L — ABNORMAL LOW (ref 3.5–5.1)
Sodium: 137 mmol/L (ref 135–145)

## 2022-09-12 LAB — TROPONIN I (HIGH SENSITIVITY): Troponin I (High Sensitivity): 3 ng/L (ref <18)

## 2022-09-12 NOTE — ED Provider Notes (Signed)
[place] EMERGENCY DEPARTMENT AT [facility] Provider Note   CSN: [id number] Arrival date & time: 09/12/22  2127     History  Chief Complaint  Patient presents with   Chest Pain     Ryan Hoffman is a 52 y.o. male.  HPI    Pt comes in w/ cc of chest pain, central chest pain. Described as tightness that started 3 hours ago, after eating dinner. Pt has no GERD hx and no pain  after eating. No n/v/dizziness, shob, sweating. No radiation of pain. Pt had remote hx of cocaine use, no use over 4 years.  No family hx of premature CAD.  Pt has no hx of PE, DVT and denies any exogenous hormone (testosterone / estrogen) use, long distance travels or surgery in the past 6 weeks, active cancer, recent immobilization.   Home Medications Prior to Admission medications   Medication Sig Start Date End Date Taking? Authorizing Provider  acetaminophen (TYLENOL) 500 MG tablet Take 1,000 mg by mouth every 6 (six) hours as needed. Patient not taking: Reported on 02/20/2021    [provider]  ALPRAZolam (XANAX) 0.5 MG tablet TAKE 1 TABLET (0.5 MG TOTAL) BY MOUTH DAILY AS NEEDED FOR ANXIETY. Patient not taking: Reported on 02/20/2021 11/09/19   Mozingo, Thereasa Solo, NP  FLUoxetine (PROZAC) 20 MG capsule Take 1 capsule (20 mg total) by mouth daily. Patient not taking: Reported on 02/20/2021 06/18/19   Mozingo, Thereasa Solo, NP  lamoTRIgine (LAMICTAL) 100 MG tablet Take 1 tablet (100 mg total) by mouth 2 (two) times daily. Patient not taking: Reported on 02/20/2021 06/18/19   Mozingo, Thereasa Solo, NP  loratadine (CLARITIN) 10 MG tablet Take 10 mg by mouth daily. Patient not taking: Reported on 02/20/2021    [provider]  Multiple Vitamins-Minerals (MULTIVITAMIN WITH MINERALS) tablet Take 1 tablet by mouth daily. Patient not taking: Reported on 02/20/2021    [provider]  QUEtiapine (SEROQUEL) 400 MG tablet Take 1 tablet (400 mg total) by mouth at  bedtime. Patient not taking: Reported on 02/20/2021 07/30/19   Mozingo, Thereasa Solo, NP  QUEtiapine (SEROQUEL) 50 MG tablet Take 1 tablet (50 mg total) by mouth 2 (two) times daily. Patient not taking: Reported on 02/20/2021 06/18/19   Mozingo, Thereasa Solo, NP  ranitidine (ZANTAC) 75 MG tablet Take 75 mg by mouth 2 (two) times daily. Patient not taking: Reported on 02/20/2021    [provider]      Allergies    Patient has no known allergies.    Review of Systems   Review of Systems  All other systems reviewed and are negative.   Physical Exam Updated Vital Signs BP (!) 157/93   Pulse 93   Temp 98.8 F (37.1 C) (Oral)   Resp 16   Ht 5\' 6"  (1.676 m)   Wt 95.3 kg   SpO2 98%   BMI 33.89 kg/m  Physical Exam Vitals and nursing note reviewed.  Constitutional:      Appearance: He is well-developed.  HENT:     Head: Atraumatic.  Cardiovascular:     Rate and Rhythm: Normal rate.     Heart sounds: Normal heart sounds.  Pulmonary:     Effort: Pulmonary effort is normal.     Breath sounds: Normal breath sounds.  Musculoskeletal:     Cervical back: Neck supple.  Skin:    General: Skin is warm.  Neurological:     Mental Status: He is alert and oriented  to person, place, and time.     ED Results / Procedures / Treatments   Labs (all labs ordered are listed, but only abnormal results are displayed) Labs Reviewed  BASIC METABOLIC PANEL - Abnormal; Notable for the following components:      Result Value   Potassium 3.4 (*)    CO2 21 (*)    Glucose, Bld 165 (*)    BUN 22 (*)    Calcium 8.6 (*)    All other components within normal limits  CBC  TROPONIN I (HIGH SENSITIVITY)  TROPONIN I (HIGH SENSITIVITY)    EKG EKG Interpretation  Date/Time:  Sunday Sep 12 2022 21:35:49 EDT Ventricular Rate:  101 PR Interval:  152 QRS Duration: 80 QT Interval:  314 QTC Calculation: 407 R Axis:   50 Text Interpretation: Sinus tachycardia Low voltage QRS Borderline  ECG When compared with ECG of 20-Feb-2021 15:41, PREVIOUS ECG IS PRESENT No acute changes No significant change since last tracing Confirmed by Derwood Kaplan ([id number]) on 09/12/2022 10:36:24 PM  Radiology DG Chest 2 View  Result Date: 09/12/2022 CLINICAL DATA:  Chest pain. EXAM: CHEST - 2 VIEW COMPARISON:  February 20, 2021 FINDINGS: The heart size and mediastinal contours are within normal limits. Both lungs are clear. Multiple chronic right-sided rib fractures are noted. IMPRESSION: No active cardiopulmonary disease. Electronically Signed   By: Aram Candela M.D.   On: 09/12/2022 22:04    Procedures Procedures    Medications Ordered in ED Medications - No data to display  ED Course/ Medical Decision Making/ A&P                             Medical Decision Making Amount and/or Complexity of Data Reviewed Labs: ordered. Radiology: ordered.   52 year old male with remote history of cocaine use comes in with chief complaint of chest pain.  Patient had central chest pain after he had something to eat.  He has history of GERD, this pain was not similar to that.  Patient is EKG is reassuring.  Differential diagnosis considered for this patient includes acute coronary syndrome, esophagitis, gastritis, chest wall pain, pneumonia, aortic dissection.  Vital signs are stable and within normal limits.  Bilateral radial pulse are equal, 2+ and there is no focal neurodeficits or complaints.  Doubt aortic dissection.  At this time, plan is to get delta troponin.  I reviewed patient's chart, it appears that he was seen in 2022 for nonspecific chest pain.  He had follow-up with PCP and they ordered a coronary CT scan, but patient has not completed it.  Patient states that he got busy and was unable to reschedule the coronary CT.  Patient's hear score is less than 3.  Plan is to get delta troponin, if negative he can be discharged with outpatient follow-up with either cardiology or PCP.   Final  Clinical Impression(s) / ED Diagnoses Final diagnoses:  Precordial chest pain    Rx / DC Orders ED Discharge Orders          Ordered    Ambulatory referral to Cardiology       Comments: If you have not heard from the Cardiology office within the next 72 hours please call [phone].   09/12/22 [id number]              Derwood Kaplan, MD 09/12/22 2355

## 2022-09-12 NOTE — Discharge Instructions (Signed)
Follow-up with [facility] Cardiology or your PCP for further cardiac workup. As discussed, it appears that your doctor had ordered a coronary CT in the past that was not completed.  Please return to the ER if you have worsening chest pain, shortness of breath, pain radiating to your jaw, shoulder, or back, sweats or fainting. Otherwise see the Cardiologist or your primary care doctor as requested.

## 2022-09-12 NOTE — ED Triage Notes (Signed)
Pt c/o nonradiating central chest pain and tightness x 2 hours PTA. Pt denies nausea, dizziness, diaphoresis, lightheadedness. Mild headache reported. No significant PMH per pt.

## 2022-09-13 LAB — TROPONIN I (HIGH SENSITIVITY): Troponin I (High Sensitivity): 4 ng/L (ref <18)

## 2022-09-13 NOTE — ED Provider Notes (Signed)
Patient signed out to me by Dr. Rhunette Croft.  Patient seen and evaluated for chest pain.  Signed out with second troponin pending.  This has now returned and is normal.  As per primary plan, patient will be discharged in follow-up as an outpatient.  Given return precautions.   Gilda Crease, MD 09/13/22 [phone]

## 2022-10-19 ENCOUNTER — Ambulatory Visit: Payer: 59 | Admitting: Internal Medicine

## 2022-12-24 ENCOUNTER — Ambulatory Visit: Payer: 59 | Attending: Internal Medicine | Admitting: Internal Medicine

## 2022-12-24 ENCOUNTER — Encounter: Payer: Self-pay | Admitting: Internal Medicine

## 2022-12-24 VITALS — BP 136/84 | HR 72 | Ht 66.0 in | Wt 216.0 lb

## 2022-12-24 DIAGNOSIS — R0789 Other chest pain: Secondary | ICD-10-CM

## 2022-12-24 NOTE — Progress Notes (Signed)
Cardiology Office Note  Date: 12/24/2022   ID: Ryan Hoffman, DOB 08/02/1970, MRN [id number]  PCP:  Benita Stabile, MD  Cardiologist:  Marjo Bicker, MD Electrophysiologist:  None   History of Present Illness: Ryan Hoffman is a 52 y.o. male known to have anxiety was referred to cardiology clinic post ER visit for chest pain.  Patient had chest tightness prompting ER visit in 5/24.  EKG and troponins within normal limits.  He had a similar episode a few years ago and was deemed to be from panic attack.  He is physically active at baseline, he works as an Personnel officer.  He walks a lot, climbs stairs, extremely physically active according to him.  He did not have any symptoms of chest pain or SOB with these exertional activities.  No DOE, orthopnea, PND, dizziness, presyncope and syncope.  Past Medical History:  Diagnosis Date   Allergy    Anxiety    Arthritis    Bipolar 1 disorder (HCC)    Chicken pox    GERD (gastroesophageal reflux disease)     Past Surgical History:  Procedure Laterality Date   ANTERIOR CRUCIATE LIGAMENT REPAIR     ELBOW SURGERY     KNEE SURGERY      Current Outpatient Medications  Medication Sig Dispense Refill   acetaminophen (TYLENOL) 500 MG tablet Take 1,000 mg by mouth every 6 (six) hours as needed. (Patient not taking: Reported on 02/20/2021)     ALPRAZolam (XANAX) 0.5 MG tablet TAKE 1 TABLET (0.5 MG TOTAL) BY MOUTH DAILY AS NEEDED FOR ANXIETY. (Patient not taking: Reported on 02/20/2021) 30 tablet 2   FLUoxetine (PROZAC) 20 MG capsule Take 1 capsule (20 mg total) by mouth daily. (Patient not taking: Reported on 02/20/2021) 90 capsule 1   lamoTRIgine (LAMICTAL) 100 MG tablet Take 1 tablet (100 mg total) by mouth 2 (two) times daily. (Patient not taking: Reported on 02/20/2021) 180 tablet 1   loratadine (CLARITIN) 10 MG tablet Take 10 mg by mouth daily. (Patient not taking: Reported on 02/20/2021)     Multiple Vitamins-Minerals (MULTIVITAMIN WITH  MINERALS) tablet Take 1 tablet by mouth daily. (Patient not taking: Reported on 02/20/2021)     QUEtiapine (SEROQUEL) 400 MG tablet Take 1 tablet (400 mg total) by mouth at bedtime. (Patient not taking: Reported on 02/20/2021) 90 tablet 1   QUEtiapine (SEROQUEL) 50 MG tablet Take 1 tablet (50 mg total) by mouth 2 (two) times daily. (Patient not taking: Reported on 02/20/2021) 180 tablet 1   ranitidine (ZANTAC) 75 MG tablet Take 75 mg by mouth 2 (two) times daily. (Patient not taking: Reported on 02/20/2021)     No current facility-administered medications for this visit.   Allergies:  Patient has no known allergies.   Social History: The patient  reports that he has been smoking cigarettes. His smokeless tobacco use includes snuff. He reports current alcohol use. He reports current drug use. Drugs: Methamphetamines and Marijuana.   Family History: The patient's family history includes Arthritis in his maternal grandfather, maternal grandmother, paternal grandfather, and paternal grandmother; Heart disease in his maternal grandfather, maternal grandmother, paternal grandfather, and paternal grandmother; Hyperlipidemia in his father and mother; Hypertension in his father.   ROS:  Please see the history of present illness. Otherwise, complete review of systems is positive for none.  All other systems are reviewed and negative.   Physical Exam: VS:  BP 136/84 (BP Location: Right Arm, Patient Position: Sitting, Cuff Size: Normal)  Pulse 72   Ht 5\' 6"  (1.676 m)   Wt 216 lb (98 kg)   SpO2 96%   BMI 34.86 kg/m , BMI Body mass index is 34.86 kg/m.  Wt Readings from Last 3 Encounters:  12/24/22 216 lb (98 kg)  09/12/22 210 lb (95.3 kg)  02/20/21 180 lb (81.6 kg)    General: Patient appears comfortable at rest. HEENT: Conjunctiva and lids normal, oropharynx clear with moist mucosa. Neck: Supple, no elevated JVP or carotid bruits, no thyromegaly. Lungs: Clear to auscultation, nonlabored breathing  at rest. Cardiac: Regular rate and rhythm, no S3 or significant systolic murmur, no pericardial rub. Abdomen: Soft, nontender, no hepatomegaly, bowel sounds present, no guarding or rebound. Extremities: No pitting edema, distal pulses 2+. Skin: Warm and dry. Musculoskeletal: No kyphosis. Neuropsychiatric: Alert and oriented x3, affect grossly appropriate.  Recent Labwork: 09/12/2022: BUN 22; Creatinine, Ser 1.02; Hemoglobin 15.1; Platelets 311; Potassium 3.4; Sodium 137     Component Value Date/Time   CHOL 163 06/20/2015 0824   TRIG 76.0 06/20/2015 0824   HDL 36.10 (L) 06/20/2015 0824   CHOLHDL 5 06/20/2015 0824   VLDL 15.2 06/20/2015 0824   LDLCALC 112 (H) 06/20/2015 0824     Assessment and Plan:  Noncardiac chest pain: Patient had chest tightness prompting ER visit in 5/24 and was deemed to be from panic attack.  He had similar episode a few years ago from panic attack as well.  He does not like to take medications.  EKG and troponins within normal limits.  Elevated blood-pressure reading without diagnosis of HTN: Blood pressure mildly elevated in the clinic today.  Does not check blood pressures at home.  Instruct him to check blood pressures at home, keep a log and follow-up with PCP for further management.    Medication Adjustments/Labs and Tests Ordered: Current medicines are reviewed at length with the patient today.  Concerns regarding medicines are outlined above.    Disposition:  Follow up as needed  Signed, Kelina Beauchamp Verne Spurr, MD, 12/24/2022 10:26 AM    [facility] [address] [place], [place] [address]

## 2022-12-24 NOTE — Patient Instructions (Signed)
Medication Instructions:  Your physician recommends that you continue on your current medications as directed. Please refer to the Current Medication list given to you today.  *If you need a refill on your cardiac medications before your next appointment, please call your pharmacy*   Lab Work: NONE   If you have labs (blood work) drawn today and your tests are completely normal, you will receive your results only by: MyChart Message (if you have MyChart) OR A paper copy in the mail If you have any lab test that is abnormal or we need to change your treatment, we will call you to review the results.   Testing/Procedures: NONE    Follow-Up: At [facility], you and your health needs are our priority.  As part of our continuing mission to provide you with exceptional heart care, we have created designated Provider Care Teams.  These Care Teams include your primary Cardiologist (physician) and Advanced Practice Providers (APPs -  Physician Assistants and Nurse Practitioners) who all work together to provide you with the care you need, when you need it.  We recommend signing up for the patient portal called "MyChart".  Sign up information is provided on this After Visit Summary.  MyChart is used to connect with patients for Virtual Visits (Telemedicine).  Patients are able to view lab/test results, encounter notes, upcoming appointments, etc.  Non-urgent messages can be sent to your provider as well.   To learn more about what you can do with MyChart, go to [email or web address].    Your next appointment:    As Needed   Provider:   You may see Vishnu P Mallipeddi, MD or one of the following Advanced Practice Providers on your designated Care Team:   Randall An, PA-C  Jacolyn Reedy, PA-C     Other Instructions Thank you for choosing [facility]!

## 2023-05-22 DIAGNOSIS — Z6834 Body mass index (BMI) 34.0-34.9, adult: Secondary | ICD-10-CM | POA: Diagnosis not present

## 2023-05-22 DIAGNOSIS — J069 Acute upper respiratory infection, unspecified: Secondary | ICD-10-CM | POA: Diagnosis not present

## 2023-05-22 DIAGNOSIS — E669 Obesity, unspecified: Secondary | ICD-10-CM | POA: Diagnosis not present

## 2023-05-22 DIAGNOSIS — R030 Elevated blood-pressure reading, without diagnosis of hypertension: Secondary | ICD-10-CM | POA: Diagnosis not present

## 2023-06-17 DIAGNOSIS — E785 Hyperlipidemia, unspecified: Secondary | ICD-10-CM | POA: Diagnosis not present

## 2023-06-17 DIAGNOSIS — R7303 Prediabetes: Secondary | ICD-10-CM | POA: Diagnosis not present

## 2023-06-24 DIAGNOSIS — G4730 Sleep apnea, unspecified: Secondary | ICD-10-CM | POA: Diagnosis not present

## 2023-06-24 DIAGNOSIS — K219 Gastro-esophageal reflux disease without esophagitis: Secondary | ICD-10-CM | POA: Diagnosis not present

## 2023-06-24 DIAGNOSIS — R7303 Prediabetes: Secondary | ICD-10-CM | POA: Diagnosis not present

## 2023-06-24 DIAGNOSIS — Z0000 Encounter for general adult medical examination without abnormal findings: Secondary | ICD-10-CM | POA: Diagnosis not present

## 2023-06-24 DIAGNOSIS — E785 Hyperlipidemia, unspecified: Secondary | ICD-10-CM | POA: Diagnosis not present

## 2023-11-28 DIAGNOSIS — L308 Other specified dermatitis: Secondary | ICD-10-CM | POA: Diagnosis not present

## 2023-12-12 DIAGNOSIS — L258 Unspecified contact dermatitis due to other agents: Secondary | ICD-10-CM | POA: Diagnosis not present

## 2023-12-15 DIAGNOSIS — L258 Unspecified contact dermatitis due to other agents: Secondary | ICD-10-CM | POA: Diagnosis not present

## 2023-12-30 DIAGNOSIS — E785 Hyperlipidemia, unspecified: Secondary | ICD-10-CM | POA: Diagnosis not present

## 2023-12-30 DIAGNOSIS — R7303 Prediabetes: Secondary | ICD-10-CM | POA: Diagnosis not present

## 2023-12-30 DIAGNOSIS — Z125 Encounter for screening for malignant neoplasm of prostate: Secondary | ICD-10-CM | POA: Diagnosis not present
